# Patient Record
Sex: Female | Born: 1970 | Race: Black or African American | Hispanic: No | Marital: Married | Smoking: Former smoker
Health system: Southern US, Community
[De-identification: ages and names within clinical notes are randomized; demographics above are authoritative.]

## PROBLEM LIST (undated history)

## (undated) DIAGNOSIS — C569 Malignant neoplasm of unspecified ovary: Secondary | ICD-10-CM

## (undated) DIAGNOSIS — G43909 Migraine, unspecified, not intractable, without status migrainosus: Secondary | ICD-10-CM

## (undated) HISTORY — DX: Migraine, unspecified, not intractable, without status migrainosus: G43.909

## (undated) HISTORY — PX: HERNIA REPAIR: SHX51

## (undated) HISTORY — PX: ABDOMINAL HYSTERECTOMY: SHX81

---

## 2003-11-28 ENCOUNTER — Ambulatory Visit: Admission: RE | Admit: 2003-11-28 | Discharge: 2003-11-28 | Payer: Self-pay | Admitting: Gynecology

## 2004-07-08 ENCOUNTER — Ambulatory Visit (HOSPITAL_COMMUNITY): Admission: RE | Admit: 2004-07-08 | Discharge: 2004-07-08 | Payer: Self-pay | Admitting: Obstetrics and Gynecology

## 2004-07-18 ENCOUNTER — Ambulatory Visit: Admission: RE | Admit: 2004-07-18 | Discharge: 2004-07-18 | Payer: Self-pay | Admitting: Gynecology

## 2004-09-02 ENCOUNTER — Inpatient Hospital Stay (HOSPITAL_COMMUNITY): Admission: RE | Admit: 2004-09-02 | Discharge: 2004-09-04 | Payer: Self-pay | Admitting: Obstetrics and Gynecology

## 2006-01-28 ENCOUNTER — Emergency Department (HOSPITAL_COMMUNITY): Admission: EM | Admit: 2006-01-28 | Discharge: 2006-01-29 | Payer: Self-pay | Admitting: Emergency Medicine

## 2006-01-31 ENCOUNTER — Emergency Department (HOSPITAL_COMMUNITY): Admission: EM | Admit: 2006-01-31 | Discharge: 2006-01-31 | Payer: Self-pay | Admitting: Emergency Medicine

## 2010-05-04 ENCOUNTER — Encounter: Payer: Self-pay | Admitting: Obstetrics and Gynecology

## 2011-09-21 ENCOUNTER — Emergency Department (HOSPITAL_COMMUNITY): Payer: No Typology Code available for payment source

## 2011-09-21 ENCOUNTER — Emergency Department (HOSPITAL_COMMUNITY)
Admission: EM | Admit: 2011-09-21 | Discharge: 2011-09-21 | Disposition: A | Discharge status: Home / Self Care | Payer: No Typology Code available for payment source | Attending: Emergency Medicine | Admitting: Emergency Medicine

## 2011-09-21 ENCOUNTER — Encounter (HOSPITAL_COMMUNITY): Payer: Self-pay | Admitting: *Deleted

## 2011-09-21 DIAGNOSIS — R10819 Abdominal tenderness, unspecified site: Secondary | ICD-10-CM | POA: Insufficient documentation

## 2011-09-21 DIAGNOSIS — T148XXA Other injury of unspecified body region, initial encounter: Secondary | ICD-10-CM

## 2011-09-21 DIAGNOSIS — M25559 Pain in unspecified hip: Secondary | ICD-10-CM | POA: Insufficient documentation

## 2011-09-21 DIAGNOSIS — M79609 Pain in unspecified limb: Secondary | ICD-10-CM | POA: Insufficient documentation

## 2011-09-21 DIAGNOSIS — M542 Cervicalgia: Secondary | ICD-10-CM | POA: Insufficient documentation

## 2011-09-21 DIAGNOSIS — R109 Unspecified abdominal pain: Secondary | ICD-10-CM | POA: Insufficient documentation

## 2011-09-21 DIAGNOSIS — M549 Dorsalgia, unspecified: Secondary | ICD-10-CM | POA: Insufficient documentation

## 2011-09-21 DIAGNOSIS — R51 Headache: Secondary | ICD-10-CM | POA: Insufficient documentation

## 2011-09-21 HISTORY — DX: Malignant neoplasm of unspecified ovary: C56.9

## 2011-09-21 LAB — POCT I-STAT, CHEM 8
BUN: 13 mg/dL (ref 6–23)
Calcium, Ion: 1.11 mmol/L — ABNORMAL LOW (ref 1.12–1.32)
Chloride: 112 mEq/L (ref 96–112)
Creatinine, Ser: 0.9 mg/dL (ref 0.50–1.10)
Glucose, Bld: 76 mg/dL (ref 70–99)
HCT: 33 % — ABNORMAL LOW (ref 36.0–46.0)
Hemoglobin: 11.2 g/dL — ABNORMAL LOW (ref 12.0–15.0)
Potassium: 3.6 mEq/L (ref 3.5–5.1)
Sodium: 143 mEq/L (ref 135–145)
TCO2: 19 mmol/L (ref 0–100)

## 2011-09-21 MED ORDER — IOHEXOL 300 MG/ML  SOLN
100.0000 mL | Freq: Once | INTRAMUSCULAR | Status: AC | PRN
  Administered 2011-09-21: 100 mL via INTRAVENOUS

## 2011-09-21 MED ORDER — IBUPROFEN 600 MG PO TABS: 600.0000 mg | ORAL_TABLET | Freq: Four times a day (QID) | ORAL | Status: AC | PRN

## 2011-09-21 MED ORDER — MORPHINE SULFATE 4 MG/ML IJ SOLN
4.0000 mg | Freq: Once | INTRAMUSCULAR | Status: AC
  Administered 2011-09-21: 4 mg via INTRAVENOUS
  Filled 2011-09-21: qty 1

## 2011-09-21 MED ORDER — HYDROCODONE-ACETAMINOPHEN 5-325 MG PO TABS: 2.0000 | ORAL_TABLET | ORAL | Status: AC | PRN
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
## 2011-09-21 NOTE — Discharge Instructions (Signed)
Bone Bruise  A bone bruise is a small hidden fracture of the bone. It typically occurs with bones located close to the surface of the skin.  SYMPTOMS  The pain lasts longer than a normal bruise.   The bruised area is difficult to use.   There may be discoloration or swelling of the bruised area.   When a bone bruise is found with injury to the anterior cruciate ligament (in the knee) there is often an increased:   Amount of fluid in the knee   Time the fluid in the knee lasts.   Number of days until you are walking normally and regaining the motion you had before the injury.   Number of days with pain from the injury.  DIAGNOSIS  It can only be seen on X-rays known as MRIs. This stands for magnetic resonance imaging. A regular X-ray taken of a bone bruise would appear to be normal. A bone bruise is a common injury in the knee and the heel bone (calcaneus). The problems are similar to those produced by stress fractures, which are bone injuries caused by overuse. A bone bruise may also be a sign of other injuries. For example, bone bruises are commonly found where an anterior cruciate ligament (ACL) in the knee has been pulled away from the bone (ruptured). A ligament is a tough fibrous material that connects bones together to make our joints stable. Bruises of the bone last a lot longer than bruises of the muscle or tissues beneath the skin. Bone bruises can last from days to months and are often more severe and painful than other bruises. TREATMENT Because bone bruises are sudden injuries you cannot often prevent them, other than by being extremely careful. Some things you can do to improve the condition are:  Apply ice to the sore area for 15 to 20 minutes, 3 to 4 times per day while awake for the first 2 days. Put the ice in a plastic bag, and place a towel between the bag of ice and your skin.   Keep your bruised area raised (elevated) when possible to lessen swelling.   For activity:     Use crutches when necessary; do not put weight on the injured leg until you are no longer tender.   You may walk on your affected part as the pain allows, or as instructed.   Start weight bearing gradually on the bruised part.   Continue to use crutches or a cane until you can stand without causing pain, or as instructed.   If a plaster splint was applied, wear the splint until you are seen for a follow-up examination. Rest it on nothing harder than a pillow the first 24 hours. Do not put weight on it. Do not get it wet. You may take it off to take a shower or bath.   If an air splint was applied, more air may be blown into or out of the splint as needed for comfort. You may take it off at night and to take a shower or bath.   Wiggle your toes in the splint several times per day if you are able.   You may have been given an elastic bandage to use with the plaster splint or alone. The splint is too tight if you have numbness, tingling or if your foot becomes cold and blue. Adjust the bandage to make it comfortable.   Only take over-the-counter or prescription medicines for pain, discomfort, or fever as directed by   your caregiver.   Follow all instructions for follow up with your caregiver. This includes any orthopedic referrals, physical therapy, and rehabilitation. Any delay in obtaining necessary care could result in a delay or failure of the bones to heal.  SEEK MEDICAL CARE IF:   You have an increase in bruising, swelling, or pain.   You notice coldness of your toes.   You do not get pain relief with medications.  SEEK IMMEDIATE MEDICAL CARE IF:   Your toes are numb or blue.   You have severe pain not controlled with medications.   If any of the problems that caused you to seek care are becoming worse.  Document Released: 06/20/2003 Document Revised: 03/19/2011 Document Reviewed: 11/02/2007 [facility] Patient Information 2012 [place], [place].  RESOURCE GUIDE  Dental  Problems  Patients with Medicaid: [facility] [phone]                                           [phone] [address] Phone:  [phone]                                                   Phone:  [phone]  If unable to pay or uninsured, contact:  [facility] or [facility]. to become qualified for the adult dental clinic.  Chronic Pain Problems Contact [facility]  [phone] Patients need to be referred by their primary care doctor.  Insufficient Money for Medicine Contact [employer]:  call "211" or [facility] [phone].  No Primary Care Doctor Call [facility]  [phone] Other agencies that provide inexpensive medical care    [facility]  [phone]    [facility]  [phone]    [facility]  [phone]    [facility]  [phone]    [facility]  [phone]    [facility] [place] [facility]  [phone]  Psychological Services [facility]  [phone] [facility]  [phone] [facility]   [phone] (emergency services [phone])  Abuse/Neglect [facility] Child Abuse Hotline 778-648-4541 [facility] Child Abuse Hotline 9731016163 (After Hours)  Emergency Shelter [facility] [phone]  M[facility] [place] [facility] [phone] [facility] [phone]  MRSA Hotline #:   220-305-9968    [facility] Resources  [facility]  [employer]                           [facility] [address] [place]                     [address]         [address]  [address]  Cristobal Goldmann Phone:  [phone]                                  Phone:  [phone]                   Phone:  [phone]  [facility]  Health Phone:  [phone]  [facility] Child Abuse Hotline 9176697025 [phone] (After Hours)
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
## 2011-09-21 NOTE — ED Notes (Signed)
Pt c/o lower back, L hand, L neck pain. C/o HA and ringing in ears. Reports earlier en route to hospital, she had a "stabbing" like sensation in RLQ that has since resolved. No seatbelt marks noted.
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
## 2011-09-21 NOTE — ED Notes (Signed)
Patient transported to X-ray 

## 2011-09-27 ENCOUNTER — Encounter (HOSPITAL_COMMUNITY): Payer: Self-pay | Admitting: Emergency Medicine

## 2011-09-27 DIAGNOSIS — S93409A Sprain of unspecified ligament of unspecified ankle, initial encounter: Secondary | ICD-10-CM | POA: Insufficient documentation

## 2011-09-27 DIAGNOSIS — M25579 Pain in unspecified ankle and joints of unspecified foot: Secondary | ICD-10-CM | POA: Insufficient documentation

## 2011-09-27 DIAGNOSIS — S335XXA Sprain of ligaments of lumbar spine, initial encounter: Secondary | ICD-10-CM | POA: Insufficient documentation

## 2011-09-27 DIAGNOSIS — S40019A Contusion of unspecified shoulder, initial encounter: Secondary | ICD-10-CM | POA: Insufficient documentation

## 2011-09-27 DIAGNOSIS — M549 Dorsalgia, unspecified: Secondary | ICD-10-CM | POA: Insufficient documentation
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
## 2011-09-28 ENCOUNTER — Emergency Department (HOSPITAL_COMMUNITY): Payer: No Typology Code available for payment source

## 2011-09-28 ENCOUNTER — Emergency Department (HOSPITAL_COMMUNITY)
Admission: EM | Admit: 2011-09-28 | Discharge: 2011-09-28 | Disposition: A | Discharge status: Home / Self Care | Payer: No Typology Code available for payment source | Attending: Emergency Medicine | Admitting: Emergency Medicine

## 2011-09-28 DIAGNOSIS — M25511 Pain in right shoulder: Secondary | ICD-10-CM

## 2011-09-28 DIAGNOSIS — S93401A Sprain of unspecified ligament of right ankle, initial encounter: Secondary | ICD-10-CM

## 2011-09-28 DIAGNOSIS — S39012A Strain of muscle, fascia and tendon of lower back, initial encounter: Secondary | ICD-10-CM

## 2011-09-28 MED ORDER — CYCLOBENZAPRINE HCL 10 MG PO TABS: 10.0000 mg | ORAL_TABLET | Freq: Two times a day (BID) | ORAL | Status: AC | PRN

## 2011-09-28 MED ORDER — HYDROCODONE-ACETAMINOPHEN 5-325 MG PO TABS: 1.0000 | ORAL_TABLET | ORAL | Status: AC | PRN

## 2011-09-28 MED ORDER — HYDROCODONE-ACETAMINOPHEN 5-325 MG PO TABS
1.0000 | ORAL_TABLET | Freq: Once | ORAL | Status: AC
  Administered 2011-09-28: 1 via ORAL
  Filled 2011-09-28: qty 1

## 2011-09-28 NOTE — Discharge Instructions (Signed)
Ankle Sprain An ankle sprain is an injury to the strong, fibrous tissues (ligaments) that hold the bones of your ankle joint together.  CAUSES Ankle sprain usually is caused by a fall or by twisting your ankle. People who participate in sports are more prone to these types of injuries.  SYMPTOMS  Symptoms of ankle sprain include:  Pain in your ankle. The pain may be present at rest or only when you are trying to stand or walk.   Swelling.   Bruising. Bruising may develop immediately or within 1 to 2 days after your injury.   Difficulty standing or walking.  DIAGNOSIS  Your caregiver will ask you details about your injury and perform a physical exam of your ankle to determine if you have an ankle sprain. During the physical exam, your caregiver will press and squeeze specific areas of your foot and ankle. Your caregiver will try to move your ankle in certain ways. An X-ray exam may be done to be sure a bone was not broken or a ligament did not separate from one of the bones in your ankle (avulsion).  TREATMENT  Certain types of braces can help stabilize your ankle. Your caregiver can make a recommendation for this. Your caregiver may recommend the use of medication for pain. If your sprain is severe, your caregiver may refer you to a surgeon who helps to restore function to parts of your skeletal system (orthopedist) or a physical therapist. HOME CARE INSTRUCTIONS  Apply ice to your injury for 1 to 2 days or as directed by your caregiver. Applying ice helps to reduce inflammation and pain.  Put ice in a plastic bag.   Place a towel between your skin and the bag.   Leave the ice on for 15 to 20 minutes at a time, every 2 hours while you are awake.   Take over-the-counter or prescription medicines for pain, discomfort, or fever only as directed by your caregiver.   Keep your injured leg elevated, when possible, to lessen swelling.   If your caregiver recommends crutches, use them as  instructed. Gradually, put weight on the affected ankle. Continue to use crutches or a cane until you can walk without feeling pain in your ankle.   If you have a plaster splint, wear the splint as directed by your caregiver. Do not rest it on anything harder than a pillow the first 24 hours. Do not put weight on it. Do not get it wet. You may take it off to take a shower or bath.   You may have been given an elastic bandage to wear around your ankle to provide support. If the elastic bandage is too tight (you have numbness or tingling in your foot or your foot becomes cold and blue), adjust the bandage to make it comfortable.   If you have an air splint, you may blow more air into it or let air out to make it more comfortable. You may take your splint off at night and before taking a shower or bath.   Wiggle your toes in the splint several times per day if you are able.  SEEK MEDICAL CARE IF:   You have an increase in bruising, swelling, or pain.   Your toes feel cold.   Pain relief is not achieved with medication.  SEEK IMMEDIATE MEDICAL CARE IF: Your toes are numb or blue or you have severe pain. MAKE SURE YOU:   Understand these instructions.   Will watch your condition.     Will get help right away if you are not doing well or get worse.  Document Released: 03/30/2005 Document Revised: 03/19/2011 Document Reviewed: 11/02/2007 [facility] Patient Information 2012 [place], [place].Arthralgia Your caregiver has diagnosed you as suffering from an arthralgia. Arthralgia means there is pain in a joint. This can come from many reasons including:  Bruising the joint which causes soreness (inflammation) in the joint.   Wear and tear on the joints which occur as we grow older (osteoarthritis).   Overusing the joint.   Various forms of arthritis.   Infections of the joint.  Regardless of the cause of pain in your joint, most of these different pains respond to anti-inflammatory drugs and  rest. The exception to this is when a joint is infected, and these cases are treated with antibiotics, if it is a bacterial infection. HOME CARE INSTRUCTIONS   Rest the injured area for as long as directed by your caregiver. Then slowly start using the joint as directed by your caregiver and as the pain allows. Crutches as directed may be useful if the ankles, knees or hips are involved. If the knee was splinted or casted, continue use and care as directed. If an stretchy or elastic wrapping bandage has been applied today, it should be removed and re-applied every 3 to 4 hours. It should not be applied tightly, but firmly enough to keep swelling down. Watch toes and feet for swelling, bluish discoloration, coldness, numbness or excessive pain. If any of these problems (symptoms) occur, remove the ace bandage and re-apply more loosely. If these symptoms persist, contact your caregiver or return to this location.   For the first 24 hours, keep the injured extremity elevated on pillows while lying down.   Apply ice for 15 to 20 minutes to the sore joint every couple hours while awake for the first half day. Then 3 to 4 times per day for the first 48 hours. Put the ice in a plastic bag and place a towel between the bag of ice and your skin.   Wear any splinting, casting, elastic bandage applications, or slings as instructed.   Only take over-the-counter or prescription medicines for pain, discomfort, or fever as directed by your caregiver. Do not use aspirin immediately after the injury unless instructed by your physician. Aspirin can cause increased bleeding and bruising of the tissues.   If you were given crutches, continue to use them as instructed and do not resume weight bearing on the sore joint until instructed.  Persistent pain and inability to use the sore joint as directed for more than 2 to 3 days are warning signs indicating that you should see a caregiver for a follow-up visit as soon as  possible. Initially, a hairline fracture (break in bone) may not be evident on X-rays. Persistent pain and swelling indicate that further evaluation, non-weight bearing or use of the joint (use of crutches or slings as instructed), or further X-rays are indicated. X-rays may sometimes not show a small fracture until a week or 10 days later. Make a follow-up appointment with your own caregiver or one to whom we have referred you. A radiologist (specialist in reading X-rays) may read your X-rays. Make sure you know how you are to obtain your X-ray results. Do not assume everything is normal if you do not hear from [place]. SEEK MEDICAL CARE IF: Bruising, swelling, or pain increases. SEEK IMMEDIATE MEDICAL CARE IF:   Your fingers or toes are numb or blue.   The pain  is not responding to medications and continues to stay the same or get worse.   The pain in your joint becomes severe.   You develop a fever over 102 F (38.9 C).   It becomes impossible to move or use the joint.  MAKE SURE YOU:   Understand these instructions.   Will watch your condition.   Will get help right away if you are not doing well or get worse.  Document Released: 03/30/2005 Document Revised: 03/19/2011 Document Reviewed: 11/16/2007 [facility] Patient Information 2012 [place], [place].Lumbosacral Strain Lumbosacral strain is one of the most common causes of back pain. There are many causes of back pain. Most are not serious conditions. CAUSES  Your backbone (spinal column) is made up of 24 main vertebral bodies, the sacrum, and the coccyx. These are held together by muscles and tough, fibrous tissue (ligaments). Nerve roots pass through the openings between the vertebrae. A sudden move or injury to the back may cause injury to, or pressure on, these nerves. This may result in localized back pain or pain movement (radiation) into the buttocks, down the leg, and into the foot. Sharp, shooting pain from the buttock down the back of  the leg (sciatica) is frequently associated with a ruptured (herniated) disk. Pain may be caused by muscle spasm alone. Your caregiver can often find the cause of your pain by the details of your symptoms and an exam. In some cases, you may need tests (such as X-rays). Your caregiver will work with you to decide if any tests are needed based on your specific exam. HOME CARE INSTRUCTIONS   Avoid an underactive lifestyle. Active exercise, as directed by your caregiver, is your greatest weapon against back pain.   Avoid hard physical activities (tennis, racquetball, waterskiing) if you are not in proper physical condition for it. This may aggravate or create problems.   If you have a back problem, avoid sports requiring sudden body movements. Swimming and walking are generally safer activities.   Maintain good posture.   Avoid becoming overweight (obese).   Use bed rest for only the most extreme, sudden (acute) episode. Your caregiver will help you determine how much bed rest is necessary.   For acute conditions, you may put ice on the injured area.   Put ice in a plastic bag.   Place a towel between your skin and the bag.   Leave the ice on for 15 to 20 minutes at a time, every 2 hours, or as needed.   After you are improved and more active, it may help to apply heat for 30 minutes before activities.  See your caregiver if you are having pain that lasts longer than expected. Your caregiver can advise appropriate exercises or therapy if needed. With conditioning, most back problems can be avoided. SEEK IMMEDIATE MEDICAL CARE IF:   You have numbness, tingling, weakness, or problems with the use of your arms or legs.   You experience severe back pain not relieved with medicines.   There is a change in bowel or bladder control.   You have increasing pain in any area of the body, including your belly (abdomen).   You notice shortness of breath, dizziness, or feel faint.   You feel sick  to your stomach (nauseous), are throwing up (vomiting), or become sweaty.   You notice discoloration of your toes or legs, or your feet get very cold.   Your back pain is getting worse.   You have a fever.  MAKE  SURE YOU:   Understand these instructions.   Will watch your condition.   Will get help right away if you are not doing well or get worse.  Document Released: 01/07/2005 Document Revised: 03/19/2011 Document Reviewed: 06/29/2008 [facility] Patient Information 2012 [place], [place].
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
## 2011-09-28 NOTE — ED Notes (Signed)
Patient discharge via ambulatory with steady gait. Respirations equal and unlabored. Skin warm and dry. No acute distress noted. 

## 2011-09-29 NOTE — ED Provider Notes (Signed)
Medical screening examination/treatment/procedure(s) were performed by non-physician practitioner and as supervising physician I was immediately available for consultation/collaboration.  Olivia Mackie, MD 09/29/11 1023

## 2011-11-12 ENCOUNTER — Other Ambulatory Visit (HOSPITAL_COMMUNITY): Payer: Self-pay | Admitting: Obstetrics and Gynecology

## 2011-11-12 DIAGNOSIS — Z1231 Encounter for screening mammogram for malignant neoplasm of breast: Secondary | ICD-10-CM

## 2011-12-01 ENCOUNTER — Ambulatory Visit (HOSPITAL_COMMUNITY)
Admission: RE | Admit: 2011-12-01 | Discharge: 2011-12-01 | Disposition: A | Discharge status: Home / Self Care | Payer: Medicaid Other | Source: Ambulatory Visit | Attending: Obstetrics and Gynecology | Admitting: Obstetrics and Gynecology

## 2011-12-01 DIAGNOSIS — Z1231 Encounter for screening mammogram for malignant neoplasm of breast: Secondary | ICD-10-CM | POA: Insufficient documentation

## 2012-09-12 ENCOUNTER — Encounter (HOSPITAL_COMMUNITY): Payer: Self-pay | Admitting: Emergency Medicine

## 2012-09-12 ENCOUNTER — Emergency Department (HOSPITAL_COMMUNITY): Payer: Self-pay

## 2012-09-12 ENCOUNTER — Emergency Department (HOSPITAL_COMMUNITY)
Admission: EM | Admit: 2012-09-12 | Discharge: 2012-09-12 | Disposition: A | Discharge status: Home / Self Care | Payer: Self-pay | Attending: Emergency Medicine | Admitting: Emergency Medicine

## 2012-09-12 DIAGNOSIS — M25572 Pain in left ankle and joints of left foot: Secondary | ICD-10-CM

## 2012-09-12 DIAGNOSIS — Z87891 Personal history of nicotine dependence: Secondary | ICD-10-CM | POA: Insufficient documentation

## 2012-09-12 DIAGNOSIS — Z79899 Other long term (current) drug therapy: Secondary | ICD-10-CM | POA: Insufficient documentation

## 2012-09-12 DIAGNOSIS — M79609 Pain in unspecified limb: Secondary | ICD-10-CM | POA: Insufficient documentation

## 2012-09-12 DIAGNOSIS — M79605 Pain in left leg: Secondary | ICD-10-CM

## 2012-09-12 DIAGNOSIS — Z8543 Personal history of malignant neoplasm of ovary: Secondary | ICD-10-CM | POA: Insufficient documentation

## 2012-09-12 DIAGNOSIS — M25579 Pain in unspecified ankle and joints of unspecified foot: Secondary | ICD-10-CM | POA: Insufficient documentation

## 2012-09-12 MED ORDER — OXYCODONE-ACETAMINOPHEN 5-325 MG PO TABS
1.0000 | ORAL_TABLET | Freq: Once | ORAL | Status: AC
  Administered 2012-09-12: 1 via ORAL
  Filled 2012-09-12: qty 1

## 2012-09-12 MED ORDER — NAPROXEN 375 MG PO TABS: 375.0000 mg | ORAL_TABLET | Freq: Two times a day (BID) | ORAL | Status: DC

## 2012-09-12 NOTE — ED Notes (Signed)
Pt has been jogging for 2 weeks. Started 3 days ago with left knee and ankle pain. No deformity.

## 2012-09-12 NOTE — ED Notes (Signed)
States has been jogging and now her  Left knee hurts all down to foot and her toes are swollen hurts to bear wt she states

## 2012-09-12 NOTE — ED Notes (Signed)
Pt currently in xray

## 2012-09-12 NOTE — ED Provider Notes (Signed)
History    This chart was scribed for Cynthia Mutton, PA working with Devoria Albe, MD by ED Scribe, Burman Nieves. This patient was seen in room TR06C/TR06C and the patient's care was started at 3:08 PM.   CSN: [id number]  Arrival date & time 09/12/12  1351   First MD Initiated Contact with Patient 09/12/12 1508      Chief Complaint  Patient presents with  . Leg Pain    (Consider location/radiation/quality/duration/timing/severity/associated sxs/prior treatment) The history is provided by the patient and a relative. No language interpreter was used.   HPI Comments: Cynthia Wang is a 42 y.o. female who presents to the Emergency Department complaining of moderate constant left leg pain which started three days ago with associated left ankle pain. Pt states that she has been jogging with her daughter constantly for the past week at different elevations. She reports the pain in her left knee is separate than the pain in her left ankle. She states she was exerting herself more than usual  Followed by stepping unevenly on some concrete resulting in hearing a pop noise. She states that it feels like something is loose with a stabbing sensation in her left knee. Pain is exacerbates during any weight bearing activity. She states she can not bend or extend her leg out completely due to pain. Pt reports she put some bio freeze last night on the affected area with no immediate relief. Pt denies ever having any injury to the affected area. Pt denies numbness/tingling in her lower extremities, chest pain, fever, chills, cough, nausea, vomiting, diarrhea, SOB, weakness, and any other associated symptoms. Pt's current PCP is Dr. Shon Baton.    Past Medical History  Diagnosis Date  . Ovarian cancer     Past Surgical History  Procedure Laterality Date  . Abdominal hysterectomy      No family history on file.  History  Substance Use Topics  . Smoking status: Former Smoker    Quit date: 09/27/2010  .  Smokeless tobacco: Never Used  . Alcohol Use: No    OB History   Grav Para Term Preterm Abortions TAB SAB Ect Mult Living                  Review of Systems  Musculoskeletal: Positive for myalgias and arthralgias.  All other systems reviewed and are negative.    Allergies  Neosporin  Home Medications   Current Outpatient Rx  Name  Route  Sig  Dispense  Refill  . ALPRAZolam (XANAX) 1 MG tablet   Oral   Take 1 mg by mouth 3 (three) times daily as needed for sleep or anxiety.         Marland Kitchen HYDROcodone-acetaminophen (NORCO) 10-325 MG per tablet   Oral   Take 1 tablet by mouth every 6 (six) hours as needed for pain.         Marland Kitchen ibuprofen (ADVIL,MOTRIN) 800 MG tablet   Oral   Take 800 mg by mouth every 8 (eight) hours as needed for pain.         . naproxen (NAPROSYN) 375 MG tablet   Oral   Take 1 tablet (375 mg total) by mouth 2 (two) times daily.   20 tablet   0     BP 127/73  Pulse 62  Temp(Src) 97.6 F (36.4 C) (Oral)  Resp 16  SpO2 99%  Physical Exam  Nursing note and vitals reviewed. Constitutional: She is oriented to person, place, and time. She appears  well-developed and well-nourished. No distress.  HENT:  Head: Normocephalic and atraumatic.  Eyes: EOM are normal.  Neck: Normal range of motion. Neck supple. No tracheal deviation present.  Cardiovascular: Normal rate.   Pulses:      Radial pulses are 2+ on the right side, and 2+ on the left side.       Dorsalis pedis pulses are 2+ on the right side, and 2+ on the left side.  Pulmonary/Chest: Effort normal. No respiratory distress.  Musculoskeletal: She exhibits edema and tenderness.  Swelling is evident to the lateral malleolus. Pain upon palpation to the medial aspect of the left ankle to base of big toe on the left side. Pain upon medial aspect of left knee, swelling noted. Negative erythema, effusion, inflammation, and warm to touch. Negative anterior and posterior draw sign. Positive Mcmurray's in  left leg. Positive valgus negative varus tension. No pain upon palpation to patella tendon or patella.    Neurological: She is alert and oriented to person, place, and time.  Sensation in intact with dull and sharp sensations.   Skin: Skin is warm and dry.  Psychiatric: She has a normal mood and affect. Her behavior is normal.    ED Course  Procedures (including critical care time) DIAGNOSTIC STUDIES: Oxygen Saturation is 99% on room air, normal by my interpretation.    COORDINATION OF CARE: 4:04 PM Discussed ED treatment with pt and pt agrees.  6:26 PM Discussed with pt to wear brace when ambulating and ice 3-4 times a day. Prescribed naproxen and tylenol for pt. Follow up with Dr. Caesar Bookman.   Labs Reviewed - No data to display Dg Ankle Complete Left  09/12/2012   *RADIOLOGY REPORT*  Clinical Data: Left ankle pain.  LEFT ANKLE COMPLETE - 3+ VIEW  Comparison: None  Findings: The ankle mortise is maintained.  No acute ankle fracture.  No osteochondral lesion.  The mid and hind foot bony structures are intact.  No definite ankle joint effusion.  IMPRESSION: No acute bony findings.   Original Report Authenticated By: Rudie Meyer, M.D.   Dg Knee Complete 4 Views Left  09/12/2012   *RADIOLOGY REPORT*  Clinical Data: Generalized left knee pain for 3 days.  LEFT KNEE - COMPLETE 4+ VIEW  Comparison: None.  Findings: Anatomic alignment of the left knee.  There is no fracture.  No definite effusion. Joint spaces appear preserved.  IMPRESSION: Negative.   Original Report Authenticated By: Andreas Newport, M.D.     1. Leg pain, left   2. Ankle pain, left       MDM  I personally performed the services described in this documentation, which was scribed in my presence. The recorded information has been reviewed and is accurate.  Patient stable, afebrile. Presenting with left knee and ankle pain after jogging for 2 weeks straight. Decreased ROM to the left knee secondary to pain, valgus tension pain.  Negative posterior-anterior draw sign. Negative warmth to touch, effusion, inflammation - ruling out septic joint. Swelling noted to left knee and left ankle - without ecchymosis. Negative pain to posterior aspect of left knee - negative calf pain - negative pain with passive motion - ruling out baker's cyst. Pain controlled in ED setting. Patient placed in knee immobilizer and ace bandage of left ankle - has crutches - discussed no weight bearing. Negative acute findings - suspicion for knee and ankle sprain secondary to stress. Patient to be discharged. Discussed rest, ice, elevation. Discussed to keep in ace bandage and immobilizer.  Referred to orthopedics for follow-up. Discussed with patient no strenuous activity. Small dose of pain medication given - discussed course and precautions. Discussed with patient to monitor symptoms and if symptoms are to worsen or change to report back to the ED. Patient agreed to plan of care, understood, all questions answered.    Cynthia Mutton, PA-C 09/13/12 7829  Cynthia Mutton, PA-C 09/13/12 5621

## 2012-09-12 NOTE — Progress Notes (Signed)
Orthopedic Tech Progress Note Patient Details:  Cynthia Wang 1970-07-05 [id number]  Ortho Devices Type of Ortho Device: Ace wrap;Knee Immobilizer Ortho Device/Splint Location: left leg/ankle Ortho Device/Splint Interventions: Application   [name] 09/12/2012, 6:27 PM

## 2012-09-13 NOTE — ED Provider Notes (Signed)
Medical screening examination/treatment/procedure(s) were performed by non-physician practitioner and as supervising physician I was immediately available for consultation/collaboration. Shylah Dossantos, MD, FACEP   Sherard Sutch L Keston Seever, MD 09/13/12 1237 

## 2014-05-11 ENCOUNTER — Other Ambulatory Visit (HOSPITAL_COMMUNITY): Payer: Self-pay | Admitting: Obstetrics and Gynecology

## 2014-05-11 DIAGNOSIS — Z1231 Encounter for screening mammogram for malignant neoplasm of breast: Secondary | ICD-10-CM

## 2014-05-18 ENCOUNTER — Ambulatory Visit (HOSPITAL_COMMUNITY)
Admission: RE | Admit: 2014-05-18 | Discharge: 2014-05-18 | Disposition: A | Discharge status: Home / Self Care | Payer: PRIVATE HEALTH INSURANCE | Source: Ambulatory Visit | Attending: Obstetrics and Gynecology | Admitting: Obstetrics and Gynecology

## 2014-05-18 ENCOUNTER — Ambulatory Visit (HOSPITAL_COMMUNITY): Payer: No Typology Code available for payment source

## 2014-05-18 DIAGNOSIS — Z1231 Encounter for screening mammogram for malignant neoplasm of breast: Secondary | ICD-10-CM | POA: Insufficient documentation

## 2014-08-09 ENCOUNTER — Other Ambulatory Visit (HOSPITAL_COMMUNITY): Payer: Self-pay | Admitting: Obstetrics and Gynecology

## 2014-08-09 DIAGNOSIS — R1011 Right upper quadrant pain: Secondary | ICD-10-CM

## 2014-08-09 DIAGNOSIS — Z8543 Personal history of malignant neoplasm of ovary: Secondary | ICD-10-CM

## 2014-08-09 DIAGNOSIS — R1901 Right upper quadrant abdominal swelling, mass and lump: Secondary | ICD-10-CM

## 2014-08-10 ENCOUNTER — Ambulatory Visit (HOSPITAL_COMMUNITY): Payer: PRIVATE HEALTH INSURANCE

## 2014-08-30 ENCOUNTER — Ambulatory Visit (HOSPITAL_COMMUNITY)
Admission: RE | Admit: 2014-08-30 | Discharge: 2014-08-30 | Disposition: A | Discharge status: Home / Self Care | Payer: 59 | Source: Ambulatory Visit | Attending: Obstetrics and Gynecology | Admitting: Obstetrics and Gynecology

## 2014-08-30 DIAGNOSIS — Z8543 Personal history of malignant neoplasm of ovary: Secondary | ICD-10-CM

## 2014-08-30 DIAGNOSIS — Z87891 Personal history of nicotine dependence: Secondary | ICD-10-CM | POA: Diagnosis not present

## 2014-08-30 DIAGNOSIS — R1901 Right upper quadrant abdominal swelling, mass and lump: Secondary | ICD-10-CM | POA: Diagnosis not present

## 2014-08-30 DIAGNOSIS — R1011 Right upper quadrant pain: Secondary | ICD-10-CM | POA: Diagnosis not present

## 2014-08-30 MED ORDER — IOHEXOL 300 MG/ML  SOLN
100.0000 mL | Freq: Once | INTRAMUSCULAR | Status: AC | PRN
  Administered 2014-08-30: 100 mL via INTRAVENOUS

## 2014-09-03 ENCOUNTER — Ambulatory Visit (INDEPENDENT_AMBULATORY_CARE_PROVIDER_SITE_OTHER): Payer: 59 | Admitting: Family Medicine

## 2014-09-03 VITALS — BP 118/72 | HR 66 | Temp 98.0°F | Resp 18 | Ht 66.0 in | Wt 145.0 lb

## 2014-09-03 DIAGNOSIS — J0100 Acute maxillary sinusitis, unspecified: Secondary | ICD-10-CM

## 2014-09-03 MED ORDER — AMOXICILLIN-POT CLAVULANATE 875-125 MG PO TABS: 1.0000 | ORAL_TABLET | Freq: Two times a day (BID) | ORAL | Status: DC

## 2014-09-03 NOTE — Patient Instructions (Signed)
I would recommend you using Afrin nasal spray (oxymetazoline)  twice a day for 2 days and then only at night after that until your symptoms have resolved.    Sinusitis Sinusitis is redness, soreness, and inflammation of the paranasal sinuses. Paranasal sinuses are air pockets within the bones of your face (beneath the eyes, the middle of the forehead, or above the eyes). In healthy paranasal sinuses, mucus is able to drain out, and air is able to circulate through them by way of your nose. However, when your paranasal sinuses are inflamed, mucus and air can become trapped. This can allow bacteria and other germs to grow and cause infection. Sinusitis can develop quickly and last only a short time (acute) or continue over a long period (chronic). Sinusitis that lasts for more than 12 weeks is considered chronic.  CAUSES  Causes of sinusitis include:  Allergies.  Structural abnormalities, such as displacement of the cartilage that separates your nostrils (deviated septum), which can decrease the air flow through your nose and sinuses and affect sinus drainage.  Functional abnormalities, such as when the small hairs (cilia) that line your sinuses and help remove mucus do not work properly or are not present. SIGNS AND SYMPTOMS  Symptoms of acute and chronic sinusitis are the same. The primary symptoms are pain and pressure around the affected sinuses. Other symptoms include:  Upper toothache.  Earache.  Headache.  Bad breath.  Decreased sense of smell and taste.  A cough, which worsens when you are lying flat.  Fatigue.  Fever.  Thick drainage from your nose, which often is green and may contain pus (purulent).  Swelling and warmth over the affected sinuses. DIAGNOSIS  Your health care provider will perform a physical exam. During the exam, your health care provider may:  Look in your nose for signs of abnormal growths in your nostrils (nasal polyps).  Tap over the affected  sinus to check for signs of infection.  View the inside of your sinuses (endoscopy) using an imaging device that has a light attached (endoscope). If your health care provider suspects that you have chronic sinusitis, one or more of the following tests may be recommended:  Allergy tests.  Nasal culture. A sample of mucus is taken from your nose, sent to a lab, and screened for bacteria.  Nasal cytology. A sample of mucus is taken from your nose and examined by your health care provider to determine if your sinusitis is related to an allergy. TREATMENT  Most cases of acute sinusitis are related to a viral infection and will resolve on their own within 10 days. Sometimes medicines are prescribed to help relieve symptoms (pain medicine, decongestants, nasal steroid sprays, or saline sprays).  However, for sinusitis related to a bacterial infection, your health care provider will prescribe antibiotic medicines. These are medicines that will help kill the bacteria causing the infection.  Rarely, sinusitis is caused by a fungal infection. In theses cases, your health care provider will prescribe antifungal medicine. For some cases of chronic sinusitis, surgery is needed. Generally, these are cases in which sinusitis recurs more than 3 times per year, despite other treatments. HOME CARE INSTRUCTIONS   Drink plenty of water. Water helps thin the mucus so your sinuses can drain more easily.  Use a humidifier.  Inhale steam 3 to 4 times a day (for example, sit in the bathroom with the shower running).  Apply a warm, moist washcloth to your face 3 to 4 times a day, or  as directed by your health care provider.  Use saline nasal sprays to help moisten and clean your sinuses.  Take medicines only as directed by your health care provider.  If you were prescribed either an antibiotic or antifungal medicine, finish it all even if you start to feel better. SEEK IMMEDIATE MEDICAL CARE IF:  You have  increasing pain or severe headaches.  You have nausea, vomiting, or drowsiness.  You have swelling around your face.  You have vision problems.  You have a stiff neck.  You have difficulty breathing. MAKE SURE YOU:   Understand these instructions.  Will watch your condition.  Will get help right away if you are not doing well or get worse. Document Released: 03/30/2005 Document Revised: 08/14/2013 Document Reviewed: 04/14/2011 [facility] Patient Information 2015 [place], [place]. This information is not intended to replace advice given to you by your health care provider. Make sure you discuss any questions you have with your health care provider.

## 2014-09-03 NOTE — Progress Notes (Signed)
° °  Subjective:  This chart was scribed for Robyn Haber, MD by Moises Blood, Medical Scribe. This patient was seen in room 11 and the patient's care was started 12:54 PM.    Patient ID: Cynthia Wang, female    DOB: Jan 24, 1971, 43 y.o.   MRN: [id number]  HPI Cynthia Wang is a 44 y.o. female who presents to [facility] complaining of gradual onset nasal congestion starting last week. She has associated symptoms of rhinorrhea and productive green, "stinky" phlegm. She hasn't taken any medication to relieve it. She denies cough.   She works at [employer]. 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     Review of Systems  HENT: Positive for congestion, rhinorrhea and sinus pressure.   Respiratory: Negative for cough.        Objective:   Physical Exam  Nursing note and vitals reviewed. BP 118/72 mmHg   Pulse 66   Temp(Src) 98 F (36.7 C) (Oral)   Resp 18   Ht 5\' 6"  (1.676 m)   Wt 145 lb (65.772 kg)   BMI 23.41 kg/m2 HEENT: Remarkable only for narrowed sinus passages, ears and oropharynx are clear Neck: Supple no adenopathy Chest: Clear Heart: Regular no murmur Patient has a nasal voice There is no rashes Patient has tenderness over her left maxilla        Assessment & Plan:   This chart was scribed in my presence and reviewed by me personally.    ICD-9-CM ICD-10-CM   1. Acute maxillary sinusitis, recurrence not specified 461.0 J01.00 amoxicillin-clavulanate (AUGMENTIN) 875-125 MG per tablet   Patient instructed to use Afrin on a temporary basis to get some rest.  Signed, Robyn Haber, MD

## 2014-09-25 ENCOUNTER — Telehealth: Payer: Self-pay

## 2014-09-25 NOTE — Telephone Encounter (Signed)
[facility] [address]   Requesting more antibiotics - still is no better.  [phone]

## 2014-09-25 NOTE — Telephone Encounter (Signed)
Spoke with pt, advised to RTC. Pt will come in tomorrow.

## 2014-09-28 ENCOUNTER — Ambulatory Visit (INDEPENDENT_AMBULATORY_CARE_PROVIDER_SITE_OTHER): Payer: 59 | Admitting: Family Medicine

## 2014-09-28 ENCOUNTER — Ambulatory Visit (INDEPENDENT_AMBULATORY_CARE_PROVIDER_SITE_OTHER): Payer: 59

## 2014-09-28 VITALS — BP 110/70 | HR 72 | Temp 97.6°F | Resp 16 | Ht 66.0 in | Wt 140.6 lb

## 2014-09-28 DIAGNOSIS — R938 Abnormal findings on diagnostic imaging of other specified body structures: Secondary | ICD-10-CM

## 2014-09-28 DIAGNOSIS — R930 Abnormal findings on diagnostic imaging of skull and head, not elsewhere classified: Secondary | ICD-10-CM

## 2014-09-28 DIAGNOSIS — J0101 Acute recurrent maxillary sinusitis: Secondary | ICD-10-CM | POA: Diagnosis not present

## 2014-09-28 LAB — POCT CBC
Granulocyte percent: 61.9 %G (ref 37–80)
HCT, POC: 40.8 % (ref 37.7–47.9)
Hemoglobin: 13.1 g/dL (ref 12.2–16.2)
Lymph, poc: 2 (ref 0.6–3.4)
MCH, POC: 27 pg (ref 27–31.2)
MCHC: 32.2 g/dL (ref 31.8–35.4)
MCV: 83.8 fL (ref 80–97)
MID (cbc): 0.4 (ref 0–0.9)
MPV: 8.6 fL (ref 0–99.8)
POC Granulocyte: 4 (ref 2–6.9)
POC LYMPH PERCENT: 31.4 %L (ref 10–50)
POC MID %: 6.7 %M (ref 0–12)
Platelet Count, POC: 239 10*3/uL (ref 142–424)
RBC: 4.87 M/uL (ref 4.04–5.48)
RDW, POC: 15.8 %
WBC: 6.5 10*3/uL (ref 4.6–10.2)

## 2014-09-28 LAB — POCT SEDIMENTATION RATE: POCT SED RATE: 32 mm/hr — AB (ref 0–22)

## 2014-09-28 MED ORDER — LEVOFLOXACIN 500 MG PO TABS: 500.0000 mg | ORAL_TABLET | Freq: Every day | ORAL | Status: DC

## 2014-09-28 NOTE — Progress Notes (Signed)
09/28/2014 at 12:46 PM  Cynthia Wang / DOB: 05-31-1970 / MRN: [id number]  The patient  does not have a problem list on file.  SUBJECTIVE  Chief complaint: pain left side of face and nasla congestion   Cynthia Wang is a 44 y.o. female complaining of nasal blockage and sinus and nasal congestion that started greater than 30 days ago.  Associated symptoms include headache and teeth pain today, and she denies fever, cough and sore throat.The patient symptoms show no change. Treatments tried thus far include Amox-Clav with good  relief initially, however after day 7 of treatment her symtpoms began to come back and she has been suffering since. She completed 10 days of therapy and did not miss doses.  She denies sick contacts.   She  has a past medical history of Ovarian cancer.    Medications reviewed and updated by myself where necessary, and exist elsewhere in the encounter.   Ms. Nolon Bussing is allergic to neosporin. She  reports that she quit smoking about 4 years ago. She has never used smokeless tobacco. She reports that she does not drink alcohol or use illicit drugs. She  has no sexual activity history on file. The patient  has past surgical history that includes Abdominal hysterectomy.  Her family history is not on file.  Review of Systems  Constitutional: Negative for fever and chills.  Respiratory: Negative for shortness of breath.   Cardiovascular: Negative for chest pain.  Gastrointestinal: Negative for nausea and abdominal pain.  Genitourinary: Negative.   Skin: Negative for rash.  Neurological: Negative for dizziness and headaches.    OBJECTIVE  Her  height is 5\' 6"  (1.676 m) and weight is 140 lb 9.6 oz (63.776 kg). Her oral temperature is 97.6 F (36.4 C). Her blood pressure is 110/70 and her pulse is 72. Her respiration is 16.  The patient's body mass index is 22.7 kg/(m^2).  Physical Exam  Constitutional: She is oriented to person, place, and time. She appears  well-developed and well-nourished. No distress.  HENT:  Right Ear: Hearing, tympanic membrane, external ear and ear canal normal.  Left Ear: Hearing, tympanic membrane, external ear and ear canal normal.  Nose: Mucosal edema present. Right sinus exhibits no maxillary sinus tenderness and no frontal sinus tenderness. Left sinus exhibits maxillary sinus tenderness. Left sinus exhibits no frontal sinus tenderness.    Mouth/Throat: Uvula is midline, oropharynx is clear and moist and mucous membranes are normal.  Cardiovascular: Normal rate, regular rhythm and normal heart sounds.   Respiratory: Effort normal and breath sounds normal. She has no wheezes. She has no rales.  Neurological: She is alert and oriented to person, place, and time.  Skin: Skin is warm and dry. She is not diaphoretic.  Psychiatric: She has a normal mood and affect.    Results for orders placed or performed in visit on 09/28/14 (from the past 24 hour(s))  POCT CBC     Status: None   Collection Time: 09/28/14 12:23 PM  Result Value Ref Range   WBC 6.5 4.6 - 10.2 K/uL   Lymph, poc 2.0 0.6 - 3.4   POC LYMPH PERCENT 31.4 10 - 50 %L   MID (cbc) 0.4 0 - 0.9   POC MID % 6.7 0 - 12 %M   POC Granulocyte 4.0 2 - 6.9   Granulocyte percent 61.9 37 - 80 %G   RBC 4.87 4.04 - 5.48 M/uL   Hemoglobin 13.1 12.2 - 16.2 g/dL   HCT, POC  40.8 37.7 - 47.9 %   MCV 83.8 80 - 97 fL   MCH, POC 27.0 27 - 31.2 pg   MCHC 32.2 31.8 - 35.4 g/dL   RDW, POC 15.8 %   Platelet Count, POC 239 142 - 424 K/uL   MPV 8.6 0 - 99.8 fL   [facility] reading (PRIMARY) by  Dr. Joseph Art: Air fluid levels noted in left maxillary sinus.    ASSESSMENT & PLAN  [name] was seen today for pain left side of face and nasla congestion.  Diagnoses and all orders for this visit:  Acute recurrent maxillary sinusitis: Patient with air fluid level on radiograph.  She has failed an excellent eradication therapy.  Will step up abx therapy to Levo.  Should she fail  treatment will refer to ENT for further work up.  Orders: -     DG Sinuses Complete; Future -     POCT SEDIMENTATION RATE -     POCT CBC -     levofloxacin (LEVAQUIN) 500 MG tablet; Take 1 tablet (500 mg total) by mouth daily.  Abnormal x-ray of paranasal sinus with air-fluid level on the left.    The patient was advised to call or come back to clinic if she does not see an improvement in symptoms, or worsens with the above plan.   Philis Fendt, MHS, PA-C [facility] 09/28/2014 12:46 PM  History, physical, and x-rays reviewed with Mr. Carlis Abbott and I agree with this assessment and plan.  Signed, Carola Frost.D.

## 2014-12-07 ENCOUNTER — Encounter (INDEPENDENT_AMBULATORY_CARE_PROVIDER_SITE_OTHER): Payer: Self-pay

## 2014-12-07 ENCOUNTER — Ambulatory Visit (INDEPENDENT_AMBULATORY_CARE_PROVIDER_SITE_OTHER): Payer: 59 | Admitting: Family

## 2014-12-07 ENCOUNTER — Encounter: Payer: Self-pay | Admitting: Family

## 2014-12-07 VITALS — BP 138/90 | HR 66 | Temp 97.7°F | Resp 18 | Ht 66.0 in | Wt 140.0 lb

## 2014-12-07 DIAGNOSIS — J019 Acute sinusitis, unspecified: Secondary | ICD-10-CM

## 2014-12-07 DIAGNOSIS — J0191 Acute recurrent sinusitis, unspecified: Secondary | ICD-10-CM

## 2014-12-07 HISTORY — DX: Acute sinusitis, unspecified: J01.90

## 2014-12-07 MED ORDER — PREDNISONE 10 MG PO TABS: ORAL_TABLET | ORAL | Status: DC

## 2014-12-07 MED ORDER — AMOXICILLIN-POT CLAVULANATE ER 1000-62.5 MG PO TB12: 2.0000 | ORAL_TABLET | Freq: Two times a day (BID) | ORAL | Status: DC

## 2014-12-07 MED ORDER — FLUTICASONE PROPIONATE 50 MCG/ACT NA SUSP: 2.0000 | Freq: Every day | NASAL | Status: DC

## 2014-12-07 NOTE — Progress Notes (Signed)
Pre visit review using our clinic review tool, if applicable. No additional management support is needed unless otherwise documented below in the visit note. 

## 2014-12-07 NOTE — Assessment & Plan Note (Signed)
Symptoms and exam consistent with recurrent bacterial sinusitis. Start high-dose Augmentin. Start prednisone taper. Continue over-the-counter medications as needed for symptoms relief and supportive care. Refer to ear nose and throat if symptoms do not improve.

## 2014-12-07 NOTE — Patient Instructions (Signed)
Thank you for choosing [facility].  Summary/Instructions:  Your prescription(s) have been submitted to your pharmacy or been printed and provided for you. Please take as directed and contact our office if you believe you are having problem(s) with the medication(s) or have any questions.  If your symptoms worsen or fail to improve, please contact our office for further instruction, or in case of emergency go directly to the emergency room at the closest medical facility.   6 Day Prednisone Taper Instructions:   Day 1: Two tablets before breakfast, one after lunch, one after dinner, and two at bedtime.  Day 2: One tablet before breakfast, one after lunch, one after dinner, and two at bedtime Day 3: One tablet before breakfast, one after lunch, one after dinner, and one at bedtime Day 4: One tablet before breakfast, one after lunch, and one at bedtime Day 5: One tablet before breakfast and one at bedtime Day 6: One tablet before breakfast    Sinusitis Sinusitis is redness, soreness, and inflammation of the paranasal sinuses. Paranasal sinuses are air pockets within the bones of your face (beneath the eyes, the middle of the forehead, or above the eyes). In healthy paranasal sinuses, mucus is able to drain out, and air is able to circulate through them by way of your nose. However, when your paranasal sinuses are inflamed, mucus and air can become trapped. This can allow bacteria and other germs to grow and cause infection. Sinusitis can develop quickly and last only a short time (acute) or continue over a long period (chronic). Sinusitis that lasts for more than 12 weeks is considered chronic.  CAUSES  Causes of sinusitis include:  Allergies.  Structural abnormalities, such as displacement of the cartilage that separates your nostrils (deviated septum), which can decrease the air flow through your nose and sinuses and affect sinus drainage.  Functional abnormalities, such as when  the small hairs (cilia) that line your sinuses and help remove mucus do not work properly or are not present. SIGNS AND SYMPTOMS  Symptoms of acute and chronic sinusitis are the same. The primary symptoms are pain and pressure around the affected sinuses. Other symptoms include:  Upper toothache.  Earache.  Headache.  Bad breath.  Decreased sense of smell and taste.  A cough, which worsens when you are lying flat.  Fatigue.  Fever.  Thick drainage from your nose, which often is green and may contain pus (purulent).  Swelling and warmth over the affected sinuses. DIAGNOSIS  Your health care provider will perform a physical exam. During the exam, your health care provider may:  Look in your nose for signs of abnormal growths in your nostrils (nasal polyps).  Tap over the affected sinus to check for signs of infection.  View the inside of your sinuses (endoscopy) using an imaging device that has a light attached (endoscope). If your health care provider suspects that you have chronic sinusitis, one or more of the following tests may be recommended:  Allergy tests.  Nasal culture. A sample of mucus is taken from your nose, sent to a lab, and screened for bacteria.  Nasal cytology. A sample of mucus is taken from your nose and examined by your health care provider to determine if your sinusitis is related to an allergy. TREATMENT  Most cases of acute sinusitis are related to a viral infection and will resolve on their own within 10 days. Sometimes medicines are prescribed to help relieve symptoms (pain medicine, decongestants, nasal steroid sprays, or  saline sprays).  However, for sinusitis related to a bacterial infection, your health care provider will prescribe antibiotic medicines. These are medicines that will help kill the bacteria causing the infection.  Rarely, sinusitis is caused by a fungal infection. In theses cases, your health care provider will prescribe antifungal  medicine. For some cases of chronic sinusitis, surgery is needed. Generally, these are cases in which sinusitis recurs more than 3 times per year, despite other treatments. HOME CARE INSTRUCTIONS   Drink plenty of water. Water helps thin the mucus so your sinuses can drain more easily.  Use a humidifier.  Inhale steam 3 to 4 times a day (for example, sit in the bathroom with the shower running).  Apply a warm, moist washcloth to your face 3 to 4 times a day, or as directed by your health care provider.  Use saline nasal sprays to help moisten and clean your sinuses.  Take medicines only as directed by your health care provider.  If you were prescribed either an antibiotic or antifungal medicine, finish it all even if you start to feel better. SEEK IMMEDIATE MEDICAL CARE IF:  You have increasing pain or severe headaches.  You have nausea, vomiting, or drowsiness.  You have swelling around your face.  You have vision problems.  You have a stiff neck.  You have difficulty breathing. MAKE SURE YOU:   Understand these instructions.  Will watch your condition.  Will get help right away if you are not doing well or get worse. Document Released: 03/30/2005 Document Revised: 08/14/2013 Document Reviewed: 04/14/2011 [facility] Patient Information 2015 [place], [place]. This information is not intended to replace advice given to you by your health care provider. Make sure you discuss any questions you have with your health care provider.

## 2014-12-07 NOTE — Progress Notes (Signed)
Subjective:    Patient ID: Cuca Benassi, female    DOB: [birth year], 44 y.o.   MRN: [id number]  Chief Complaint  Patient presents with  . Establish Care    has been having issues with sinuses for over a month, had a CT scan and was told she had pockets of fluid in cheek, has had stuff come out of her face, major congestion, states that she "smells poop" when bending over and coming back up, possible bacterial infection    HPI:  Donnamaria Shands is a 44 y.o. female with a PMH of migraines and ovarian cancer who presents today for an office visit to establish care.   1.) Sinuses - Associated symptom of facial and sinus pressure has been going on for a couple of months. Primarily located on the left side only. Describes a purulent nasal discharge. Modifying factors include courses of Augmentin and Levaquin which did not help very much. X-ray of her sinuses revealed prominent air-fluid level in the left maxillary sinus consistent with acute sinusitis. Other modifying factors include sudafed which helps with her symptoms for about 4 hours at a time. Also notes that when she bends over she "smells poop."  Allergies  Allergen Reactions  . Neosporin [Neomycin-Bacitracin Zn-Polymyx]      Outpatient Prescriptions Prior to Visit  Medication Sig Dispense Refill  . ESTRADIOL PO Take by mouth.    Marland Kitchen ibuprofen (ADVIL,MOTRIN) 800 MG tablet Take 800 mg by mouth every 8 (eight) hours as needed for pain.    Marland Kitchen levofloxacin (LEVAQUIN) 500 MG tablet Take 1 tablet (500 mg total) by mouth daily. 10 tablet 0   No facility-administered medications prior to visit.     Past Medical History  Diagnosis Date  . Ovarian cancer   . Migraine      Past Surgical History  Procedure Laterality Date  . Abdominal hysterectomy    . Hernia repair       Family History  Problem Relation Age of Onset  . Arthritis Mother   . Depression Mother   . Healthy Father   . Arthritis Maternal Grandmother   .  Hypertension Maternal Grandmother      Social History   Social History  . Marital Status: Single    Spouse Name: N/A  . Number of Children: 2  . Years of Education: 16   Occupational History  . [place] History Main Topics  . Smoking status: Former Smoker    Quit date: 09/27/2006  . Smokeless tobacco: Never Used  . Alcohol Use: No  . Drug Use: No  . Sexual Activity: Yes    Birth Control/ Protection: Surgical   Other Topics Concern  . Not on file   Social History Narrative   Fun: Watching softball game   Denies religious beliefs effecting health care.   Feels safe at home and denies abuse.     Review of Systems  Constitutional: Negative for fever and chills.  HENT: Positive for congestion and sinus pressure. Negative for sore throat.   Respiratory: Negative for chest tightness and shortness of breath.   Neurological: Positive for headaches.      Objective:    BP 138/90 mmHg  Pulse 66  Temp(Src) 97.7 F (36.5 C) (Oral)  Resp 18  Ht 5\' 6"  (1.676 m)  Wt 140 lb (63.504 kg)  BMI 22.61 kg/m2  SpO2 99% Nursing note and vital signs reviewed.  Physical Exam  Constitutional: She is oriented to person, place,  and time. She appears well-developed and well-nourished. No distress.  HENT:  Right Ear: Hearing, tympanic membrane, external ear and ear canal normal.  Left Ear: Hearing, tympanic membrane, external ear and ear canal normal.  Nose: Right sinus exhibits no maxillary sinus tenderness and no frontal sinus tenderness. Left sinus exhibits maxillary sinus tenderness and frontal sinus tenderness.  Mouth/Throat: Uvula is midline, oropharynx is clear and moist and mucous membranes are normal.  Cardiovascular: Normal rate, regular rhythm, normal heart sounds and intact distal pulses.   Pulmonary/Chest: Effort normal and breath sounds normal.  Neurological: She is alert and oriented to person, place, and time.  Skin: Skin is warm and dry.    Psychiatric: She has a normal mood and affect. Her behavior is normal. Judgment and thought content normal.       Assessment & Plan:   Problem List Items Addressed This Visit      Respiratory   Sinusitis, acute - Primary    Symptoms and exam consistent with recurrent bacterial sinusitis. Start high-dose Augmentin. Start prednisone taper. Continue over-the-counter medications as needed for symptoms relief and supportive care. Refer to ear nose and throat if symptoms do not improve.       Relevant Medications   amoxicillin-clavulanate (AUGMENTIN XR) 1000-62.5 MG per tablet   predniSONE (DELTASONE) 10 MG tablet   fluticasone (FLONASE) 50 MCG/ACT nasal spray   Other Relevant Orders   Ambulatory referral to ENT

## 2014-12-13 ENCOUNTER — Telehealth: Payer: Self-pay | Admitting: Family

## 2014-12-13 NOTE — Telephone Encounter (Signed)
Received records from [facility] forwarded 7 pages  to Dr. Mauricio Po 12/13/14 fbg.

## 2015-01-16 ENCOUNTER — Telehealth: Payer: Self-pay | Admitting: Family

## 2015-01-16 MED ORDER — IBUPROFEN 800 MG PO TABS: 800.0000 mg | ORAL_TABLET | Freq: Three times a day (TID) | ORAL | Status: DC | PRN

## 2015-01-16 NOTE — Telephone Encounter (Signed)
Verified [facility] [address]. Patient is requesting ibuprofen (ADVIL,MOTRIN) 800 MG tablet [[id number]] . She states that there has been a delay in ENT referral. She would like something in the meantime.

## 2015-01-16 NOTE — Telephone Encounter (Signed)
Please advise 

## 2015-01-16 NOTE — Telephone Encounter (Signed)
Medication filled.  

## 2015-01-31 ENCOUNTER — Telehealth: Payer: Self-pay | Admitting: *Deleted

## 2015-01-31 MED ORDER — IBUPROFEN 800 MG PO TABS: 800.0000 mg | ORAL_TABLET | Freq: Three times a day (TID) | ORAL | Status: DC | PRN

## 2015-01-31 NOTE — Telephone Encounter (Signed)
Medications refilled

## 2015-01-31 NOTE — Telephone Encounter (Signed)
Left msg on triage stating wanting to get a refill on the ibuprofen 800 mg for ongoing headaches...Cynthia Wang

## 2015-02-01 NOTE — Telephone Encounter (Signed)
Notified pt Cynthia Wang sent medication to pharmacy...[name]

## 2015-02-23 ENCOUNTER — Other Ambulatory Visit: Payer: Self-pay | Admitting: Family

## 2015-03-16 ENCOUNTER — Other Ambulatory Visit: Payer: Self-pay | Admitting: Family

## 2015-04-04 ENCOUNTER — Other Ambulatory Visit: Payer: Self-pay | Admitting: Family

## 2015-05-03 ENCOUNTER — Other Ambulatory Visit: Payer: Self-pay | Admitting: Family

## 2015-05-03 NOTE — Telephone Encounter (Signed)
Please advise in greg calone's absence, thanks 

## 2015-06-05 ENCOUNTER — Other Ambulatory Visit: Payer: Self-pay | Admitting: Internal Medicine

## 2015-06-30 ENCOUNTER — Other Ambulatory Visit: Payer: Self-pay | Admitting: Internal Medicine

## 2015-07-27 ENCOUNTER — Other Ambulatory Visit: Payer: Self-pay | Admitting: Family

## 2015-08-19 ENCOUNTER — Other Ambulatory Visit: Payer: Self-pay | Admitting: Family

## 2015-09-11 ENCOUNTER — Other Ambulatory Visit: Payer: Self-pay | Admitting: Family

## 2015-10-25 ENCOUNTER — Other Ambulatory Visit: Payer: Self-pay

## 2015-10-25 MED ORDER — IBUPROFEN 800 MG PO TABS: 800.0000 mg | ORAL_TABLET | Freq: Three times a day (TID) | ORAL | Status: DC | PRN

## 2015-12-08 ENCOUNTER — Other Ambulatory Visit: Payer: Self-pay | Admitting: Family

## 2016-01-10 ENCOUNTER — Other Ambulatory Visit: Payer: Self-pay | Admitting: Family

## 2016-02-03 ENCOUNTER — Other Ambulatory Visit: Payer: Self-pay | Admitting: Family

## 2016-09-12 ENCOUNTER — Emergency Department (HOSPITAL_COMMUNITY)
Admission: EM | Admit: 2016-09-12 | Discharge: 2016-09-12 | Disposition: A | Discharge status: Home / Self Care | Payer: Worker's Compensation | Attending: Emergency Medicine | Admitting: Emergency Medicine

## 2016-09-12 ENCOUNTER — Encounter (HOSPITAL_COMMUNITY): Payer: Self-pay | Admitting: *Deleted

## 2016-09-12 ENCOUNTER — Emergency Department (HOSPITAL_COMMUNITY): Payer: Worker's Compensation

## 2016-09-12 DIAGNOSIS — Z87891 Personal history of nicotine dependence: Secondary | ICD-10-CM | POA: Diagnosis not present

## 2016-09-12 DIAGNOSIS — Y999 Unspecified external cause status: Secondary | ICD-10-CM | POA: Insufficient documentation

## 2016-09-12 DIAGNOSIS — M545 Low back pain: Secondary | ICD-10-CM | POA: Diagnosis not present

## 2016-09-12 DIAGNOSIS — Y939 Activity, unspecified: Secondary | ICD-10-CM | POA: Diagnosis not present

## 2016-09-12 DIAGNOSIS — S9031XA Contusion of right foot, initial encounter: Secondary | ICD-10-CM | POA: Diagnosis not present

## 2016-09-12 DIAGNOSIS — Y92410 Unspecified street and highway as the place of occurrence of the external cause: Secondary | ICD-10-CM | POA: Insufficient documentation

## 2016-09-12 DIAGNOSIS — Z8543 Personal history of malignant neoplasm of ovary: Secondary | ICD-10-CM | POA: Insufficient documentation

## 2016-09-12 DIAGNOSIS — S299XXA Unspecified injury of thorax, initial encounter: Secondary | ICD-10-CM | POA: Diagnosis present

## 2016-09-12 DIAGNOSIS — S20212A Contusion of left front wall of thorax, initial encounter: Secondary | ICD-10-CM | POA: Insufficient documentation

## 2016-09-12 DIAGNOSIS — Z79899 Other long term (current) drug therapy: Secondary | ICD-10-CM | POA: Insufficient documentation

## 2016-09-12 MED ORDER — IBUPROFEN 400 MG PO TABS
400.0000 mg | ORAL_TABLET | Freq: Once | ORAL | Status: AC
  Administered 2016-09-12: 400 mg via ORAL
  Filled 2016-09-12: qty 1

## 2016-09-12 MED ORDER — ACETAMINOPHEN 500 MG PO TABS
1000.0000 mg | ORAL_TABLET | Freq: Once | ORAL | Status: AC
  Administered 2016-09-12: 1000 mg via ORAL
  Filled 2016-09-12: qty 2

## 2016-09-12 NOTE — Discharge Instructions (Signed)
It was our pleasure to provide your ER care today - we hope that you feel better.  Take ibuprofen and/or acetaminophen as need for pain.   Follow up with primary care doctor in 1 week symptoms fail to improve/resolve.   Return to ER if worse, new symptoms, new or severe pain, other concern.
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
## 2018-06-15 ENCOUNTER — Emergency Department (HOSPITAL_COMMUNITY)
Admission: EM | Admit: 2018-06-15 | Discharge: 2018-06-16 | Disposition: A | Discharge status: Home / Self Care | Payer: PRIVATE HEALTH INSURANCE | Attending: Emergency Medicine | Admitting: Emergency Medicine

## 2018-06-15 ENCOUNTER — Other Ambulatory Visit: Payer: Self-pay

## 2018-06-15 ENCOUNTER — Encounter (HOSPITAL_COMMUNITY): Payer: Self-pay | Admitting: Emergency Medicine

## 2018-06-15 ENCOUNTER — Emergency Department (HOSPITAL_COMMUNITY): Payer: PRIVATE HEALTH INSURANCE

## 2018-06-15 DIAGNOSIS — Z87891 Personal history of nicotine dependence: Secondary | ICD-10-CM | POA: Insufficient documentation

## 2018-06-15 DIAGNOSIS — R197 Diarrhea, unspecified: Secondary | ICD-10-CM

## 2018-06-15 DIAGNOSIS — R05 Cough: Secondary | ICD-10-CM

## 2018-06-15 DIAGNOSIS — I499 Cardiac arrhythmia, unspecified: Secondary | ICD-10-CM

## 2018-06-15 DIAGNOSIS — R109 Unspecified abdominal pain: Secondary | ICD-10-CM | POA: Diagnosis present

## 2018-06-15 DIAGNOSIS — Z79899 Other long term (current) drug therapy: Secondary | ICD-10-CM | POA: Diagnosis not present

## 2018-06-15 DIAGNOSIS — B349 Viral infection, unspecified: Secondary | ICD-10-CM | POA: Diagnosis not present

## 2018-06-15 DIAGNOSIS — R059 Cough, unspecified: Secondary | ICD-10-CM

## 2018-06-15 DIAGNOSIS — I498 Other specified cardiac arrhythmias: Secondary | ICD-10-CM

## 2018-06-15 DIAGNOSIS — R008 Other abnormalities of heart beat: Secondary | ICD-10-CM | POA: Insufficient documentation

## 2018-06-15 LAB — COMPREHENSIVE METABOLIC PANEL
ALT: 13 U/L (ref 0–44)
AST: 18 U/L (ref 15–41)
Albumin: 3.8 g/dL (ref 3.5–5.0)
Alkaline Phosphatase: 32 U/L — ABNORMAL LOW (ref 38–126)
Anion gap: 7 (ref 5–15)
BUN: 12 mg/dL (ref 6–20)
CO2: 26 mmol/L (ref 22–32)
Calcium: 9.3 mg/dL (ref 8.9–10.3)
Chloride: 108 mmol/L (ref 98–111)
Creatinine, Ser: 0.78 mg/dL (ref 0.44–1.00)
GFR calc Af Amer: >60 mL/min (ref >60)
GFR calc non Af Amer: >60 mL/min (ref >60)
Glucose, Bld: 89 mg/dL (ref 70–99)
Potassium: 3.8 mmol/L (ref 3.5–5.1)
Sodium: 141 mmol/L (ref 135–145)
Total Bilirubin: 0.8 mg/dL (ref 0.3–1.2)
Total Protein: 6.4 g/dL — ABNORMAL LOW (ref 6.5–8.1)

## 2018-06-15 LAB — LIPASE, BLOOD: Lipase: 26 U/L (ref 11–51)

## 2018-06-15 LAB — CBC
HCT: 39.5 % (ref 36.0–46.0)
Hemoglobin: 13.2 g/dL (ref 12.0–15.0)
MCH: 30.4 pg (ref 26.0–34.0)
MCHC: 33.4 g/dL (ref 30.0–36.0)
MCV: 91 fL (ref 80.0–100.0)
Platelets: 251 10*3/uL (ref 150–400)
RBC: 4.34 MIL/uL (ref 3.87–5.11)
RDW: 12.3 % (ref 11.5–15.5)
WBC: 5.4 10*3/uL (ref 4.0–10.5)
nRBC: 0 % (ref 0.0–0.2)

## 2018-06-15 MED ORDER — IOHEXOL 300 MG/ML  SOLN
100.0000 mL | Freq: Once | INTRAMUSCULAR | Status: AC | PRN
  Administered 2018-06-15: 100 mL via INTRAVENOUS

## 2018-06-15 MED ORDER — SODIUM CHLORIDE 0.9% FLUSH
3.0000 mL | Freq: Once | INTRAVENOUS | Status: AC
  Administered 2018-06-15: 3 mL via INTRAVENOUS

## 2018-06-15 MED ORDER — SODIUM CHLORIDE 0.9 % IV BOLUS
1000.0000 mL | Freq: Once | INTRAVENOUS | Status: AC
  Administered 2018-06-15: 1000 mL via INTRAVENOUS
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
## 2018-06-15 NOTE — ED Triage Notes (Signed)
Pt states she was sent by her PCP for continuous RUQ pain, nausea, vomiting, and diarrhea. States that she was diagnoses with gastroenteritis by her PCP.

## 2018-06-15 NOTE — ED Notes (Signed)
Pt refusing flu swab collection, [facility] made aware

## 2018-06-15 NOTE — ED Provider Notes (Signed)
[facility] EMERGENCY DEPARTMENT Provider Note   CSN: [id number] Arrival date & time: 06/15/18  1621    History   Chief Complaint Chief Complaint  Patient presents with  . Abdominal Pain    HPI Cynthia Wang is a 48 y.o. female.     HPI  48 year old female history of ovarian cancer 16 years ago presents today complaining of diffuse abdominal pain with cramping and diarrhea.  She states her symptoms began 2 days ago.  They began with nasal congestion, cough, sore throat, and body aches.  She does not think that she had a fever and had not had chills.  Those symptoms lasted for 2 days.  Today she began having some crampy abdominal pain and had 4 episodes of loose stools.  These did not appear bloody or dark in color.  She is complaining of some diffuse abdominal pain feels it is worse on the right side.  Past Medical History:  Diagnosis Date  . Migraine   . Ovarian cancer [facility])     Patient Active Problem List   Diagnosis Date Noted  . Sinusitis, acute 12/07/2014    Past Surgical History:  Procedure Laterality Date  . ABDOMINAL HYSTERECTOMY    . HERNIA REPAIR       OB History   No obstetric history on file.      Home Medications    Prior to Admission medications   Medication Sig Start Date End Date Taking? Authorizing Provider  amoxicillin-clavulanate (AUGMENTIN XR) 1000-62.5 MG per tablet Take 2 tablets by mouth 2 (two) times daily. 12/07/14   Golden Circle, FNP  ESTRADIOL PO Take by mouth.    [provider]  fluticasone (FLONASE) 50 MCG/ACT nasal spray Place 2 sprays into both nostrils daily. 12/07/14   Golden Circle, FNP  ibuprofen (ADVIL,MOTRIN) 800 MG tablet Take 1 tablet (800 mg total) by mouth every 8 (eight) hours as needed. Needs office visit for more refills 02/04/16   Golden Circle, FNP  predniSONE (DELTASONE) 10 MG tablet Take 6 tablets x 1 day, 5 tablets x 1 day, 4 tablets x 1 day, 3 tablets x 1 day, 2 tablets x 1  day, 1 tablets x 1 day 12/07/14   Golden Circle, FNP    Family History Family History  Problem Relation Age of Onset  . Arthritis Mother   . Depression Mother   . Healthy Father   . Arthritis Maternal Grandmother   . Hypertension Maternal Grandmother     Social History Social History   Tobacco Use  . Smoking status: Former Smoker    Last attempt to quit: 09/27/2006    Years since quitting: 11.7  . Smokeless tobacco: Never Used  Substance Use Topics  . Alcohol use: No  . Drug use: No     Allergies   Neosporin [neomycin-bacitracin zn-polymyx]   Review of Systems Review of Systems  All other systems reviewed and are negative.    Physical Exam Updated Vital Signs BP (!) 130/94   Pulse (!) 58   Temp 97.8 F (36.6 C) (Oral)   Resp (!) 25   SpO2 100%   Physical Exam Vitals signs and nursing note reviewed.  Constitutional:      Appearance: She is well-developed.  HENT:     Head: Normocephalic.     Mouth/Throat:     Mouth: Mucous membranes are moist.  Eyes:     Extraocular Movements: Extraocular movements intact.  Cardiovascular:  Rate and Rhythm: Normal rate and regular rhythm.  Pulmonary:     Effort: Pulmonary effort is normal.     Breath sounds: Normal breath sounds.  Abdominal:     General: Abdomen is flat. Bowel sounds are normal.     Palpations: Abdomen is soft.     Tenderness: There is abdominal tenderness in the right upper quadrant and right lower quadrant.  Skin:    General: Skin is warm and dry.     Capillary Refill: Capillary refill takes less than 2 seconds.  Neurological:     General: No focal deficit present.     Mental Status: She is alert. She is disoriented.  Psychiatric:        Mood and Affect: Mood normal.      ED Treatments / Results  Labs (all labs ordered are listed, but only abnormal results are displayed) Labs Reviewed  COMPREHENSIVE METABOLIC PANEL - Abnormal; Notable for the following components:      Result  Value   Total Protein 6.4 (*)    Alkaline Phosphatase 32 (*)    All other components within normal limits  LIPASE, BLOOD  CBC  URINALYSIS, ROUTINE W REFLEX MICROSCOPIC  INFLUENZA PANEL BY PCR (TYPE A & B)    EKG EKG Interpretation  Date/Time:  Wednesday June 15 2018 22:11:17 EST Ventricular Rate:  81 PR Interval:    QRS Duration: 97 QT Interval:  446 QTC Calculation: 386 R Axis:   85 Text Interpretation:  Sinus rhythm Ventricular bigeminy Consider right atrial enlargement Probable left ventricular hypertrophy Baseline wander in lead(s) I III aVL aVF Confirmed by Pattricia Boss [phone]) on 06/15/2018 10:57:04 PM   Radiology Dg Chest 2 View  Result Date: 06/15/2018 CLINICAL DATA:  Cough EXAM: CHEST - 2 VIEW COMPARISON:  09/12/2016 FINDINGS: The heart size and mediastinal contours are within normal limits. Both lungs are clear. The visualized skeletal structures are unremarkable. IMPRESSION: No active cardiopulmonary disease. Electronically Signed   By: Donavan Foil M.D.   On: 06/15/2018 22:05   Ct Abdomen Pelvis W Contrast  Result Date: 06/15/2018 CLINICAL DATA:  RIGHT upper quadrant pain, nausea, vomiting and diarrhea. History of hysterectomy and hernia repair. EXAM: CT ABDOMEN AND PELVIS WITH CONTRAST TECHNIQUE: Multidetector CT imaging of the abdomen and pelvis was performed using the standard protocol following bolus administration of intravenous contrast. CONTRAST:  143mL OMNIPAQUE IOHEXOL 300 MG/ML  SOLN COMPARISON:  CT abdomen and pelvis Aug 30, 2014 FINDINGS: LOWER CHEST: Lung bases are clear. Included heart size is normal. No pericardial effusion. HEPATOBILIARY: Liver and gallbladder are normal. PANCREAS: Normal. SPLEEN: Normal. ADRENALS/URINARY TRACT: Kidneys are orthotopic, demonstrating symmetric enhancement. No nephrolithiasis, hydronephrosis or solid renal masses. The unopacified ureters are normal in course and caliber. Delayed imaging through the kidneys demonstrates symmetric  prompt contrast excretion within the proximal urinary collecting system. Urinary bladder is partially distended and unremarkable. Normal adrenal glands. STOMACH/BOWEL: The stomach, small and large bowel are normal in course and caliber without inflammatory changes. Normal appendix. VASCULAR/LYMPHATIC: Aortoiliac vessels are normal in course and caliber. Mild intimal thickening. No lymphadenopathy by CT size criteria. REPRODUCTIVE: Status post hysterectomy. OTHER: No intraperitoneal free fluid or free air. MUSCULOSKELETAL: Nonacute. Status post ventral abdominal wall herniorrhaphy with mesh. Small ventral hernia containing fat and a vessel without inflammatory changes, this is above the level of the mesh. IMPRESSION: 1. No acute intra-abdominal/pelvic process. 2. Interval anterior abdominal wall herniorrhaphy. Small ventral fat containing hernia. Electronically Signed   By: Elon Alas  M.D.   On: 06/15/2018 21:58    Procedures Procedures (including critical care time)  Medications Ordered in ED Medications  sodium chloride flush (NS) 0.9 % injection 3 mL (3 mLs Intravenous Given 06/15/18 2159)  sodium chloride 0.9 % bolus 1,000 mL (1,000 mLs Intravenous New Bag/Given 06/15/18 2159)  iohexol (OMNIPAQUE) 300 MG/ML solution 100 mL (100 mLs Intravenous Contrast Given 06/15/18 2132)     Initial Impression / Assessment and Plan / ED Course  I have reviewed the triage vital signs and the nursing notes.  Pertinent labs & imaging results that were available during my care of the patient were reviewed by me and considered in my medical decision making (see chart for details).        Appears hemodynamically stable here.  Chest x-Ramelo Oetken without acute infiltrates.  Patient refused flu test.  Her CT of her abdomen does not show any acute abnormalities.  She has not had any loose stools here and has had some IV fluids.  She has been taking p.o. without difficulty.  However, EKG was obtained and she is in  bigeminy.  Plan consult to cardiology.  Dr. Elson Areas saw for cardiology.  He advises patient can follow-up as outpatient.  I discussed return precautions and need for follow-up with patient she voices understanding.  Final Clinical Impressions(s) / ED Diagnoses   Final diagnoses:  Diarrhea, unspecified type  Viral infection  Bigeminy    ED Discharge Orders    None       Pattricia Boss, MD 06/16/18 [phone]

## 2018-06-16 LAB — URINALYSIS, ROUTINE W REFLEX MICROSCOPIC
Bilirubin Urine: NEGATIVE
Glucose, UA: NEGATIVE mg/dL
Ketones, ur: 5 mg/dL — AB
Leukocytes,Ua: NEGATIVE
Nitrite: NEGATIVE
Protein, ur: NEGATIVE mg/dL
Specific Gravity, Urine: >1.046 — ABNORMAL HIGH (ref 1.005–1.030)
pH: 5 (ref 5.0–8.0)

## 2018-06-16 NOTE — ED Notes (Signed)
Patient verbalizes understanding of discharge instructions. Opportunity for questioning and answers were provided. Armband removed by staff, pt discharged from ED.  

## 2018-06-16 NOTE — Discharge Instructions (Signed)
Please drink frequent fluids as discussed Call cardiology for follow-up Return if you are worse at any time.

## 2018-06-20 ENCOUNTER — Encounter: Payer: Self-pay | Admitting: Cardiology

## 2018-06-20 ENCOUNTER — Ambulatory Visit (INDEPENDENT_AMBULATORY_CARE_PROVIDER_SITE_OTHER): Payer: BLUE CROSS/BLUE SHIELD | Admitting: Cardiology

## 2018-06-20 VITALS — BP 120/62 | HR 60 | Ht 67.0 in | Wt 144.0 lb

## 2018-06-20 DIAGNOSIS — I493 Ventricular premature depolarization: Secondary | ICD-10-CM

## 2018-06-20 DIAGNOSIS — R0789 Other chest pain: Secondary | ICD-10-CM | POA: Diagnosis not present

## 2018-06-20 HISTORY — DX: Ventricular premature depolarization: I49.3

## 2018-06-20 NOTE — Patient Instructions (Signed)
Medication Instructions:  Your physician recommends that you continue on your current medications as directed. Please refer to the Current Medication list given to you today.  If you need a refill on your cardiac medications before your next appointment, please call your pharmacy.   Lab work: NONE If you have labs (blood work) drawn today and your tests are completely normal, you will receive your results only by: Marland Kitchen MyChart Message (if you have MyChart) OR . A paper copy in the mail If you have any lab test that is abnormal or we need to change your treatment, we will call you to review the results.  Testing/Procedures: Your physician has requested that you have a stress echocardiogram. For further information please visit [email or web address]. Please follow instruction sheet as given.     Follow-Up: At [facility], you and your health needs are our priority.  As part of our continuing mission to provide you with exceptional heart care, we have created designated Provider Care Teams.  These Care Teams include your primary Cardiologist (physician) and Advanced Practice Providers (APPs -  Physician Assistants and Nurse Practitioners) who all work together to provide you with the care you need, when you need it. You will need a follow up appointment in 4 months.   Any Other Special Instructions Will Be Listed Below    Exercise Stress Echocardiogram  An exercise stress echocardiogram is a test that checks how well your heart is working. For this test, you will walk on a treadmill to make your heart beat faster. This test uses sound waves (ultrasound) and a computer to make pictures (images) of your heart. These pictures will be taken before you exercise and after you exercise. What happens before the procedure?  Follow instructions from your doctor about what you cannot eat or drink before the test.  Do not drink or eat anything that has caffeine in it. Stop having caffeine for 24 hours  before the test.  Ask your doctor about changing or stopping your normal medicines. This is important if you take diabetes medicines or blood thinners. Ask your doctor if you should take your medicines with water before the test.  If you use an inhaler, bring it to the test.  Do not use any products that have nicotine or tobacco in them, such as cigarettes and e-cigarettes. Stop using them for 4 hours before the test. If you need help quitting, ask your doctor.  Wear comfortable shoes and clothing. What happens during the procedure?  You will be hooked up to a TV screen. Your doctor will watch the screen to see how fast your heart beats during the test.  Before you exercise, a computer will make a picture of your heart. To do this: ? A gel will be put on your chest. ? A wand will be moved over the gel. ? Sound waves from the wand will go to the computer to make the picture.  Your will start walking on a treadmill. The treadmill will start at a slow speed. It will get faster a little bit at a time. When you walk faster, your heart will beat faster.  The treadmill will be stopped when your heart is working hard.  You will lie down right away so another picture of your heart can be taken.  The test will take 30-60 minutes. What happens after the procedure?  Your heart rate and blood pressure will be watched after the test.  If your doctor says that you can, you  may: ? Eat what you usually eat. ? Do your normal activities. ? Take medicines like normal. Summary  An exercise stress echocardiogram is a test that checks how well your heart is working.  Follow instructions about what you cannot eat or drink before the test. Ask your doctor if you should take your normal medicines before the test.  Stop having caffeine for 24 hours before the test. Do not use anything with nicotine or tobacco in it for 4 hours before the test.  A computer will take a picture of your heart before you  walk on a treadmill. It will take another picture when you are done walking.  Your heart rate and blood pressure will be watched after the test. This information is not intended to replace advice given to you by your health care provider. Make sure you discuss any questions you have with your health care provider. Document Released: 01/25/2009 Document Revised: 12/22/2015 Document Reviewed: 12/22/2015 Elsevier Interactive Patient Education  2019 [employer].

## 2018-06-20 NOTE — Progress Notes (Signed)
Cardiology Office Note:    Date:  06/20/2018   ID:  Cynthia Wang, DOB Jun 18, 1970, MRN [id number]  PCP:  Azucena Fallen, MD  Cardiologist:  Jenean Lindau, MD   Referring MD: Azucena Fallen, MD    ASSESSMENT:    1. Chest discomfort   2. Frequent PVCs    PLAN:    In order of problems listed above:  1. Primary prevention stressed with the patient.  Importance of compliance with diet and medication stressed and she vocalized understanding.  Her blood pressure is stable.  Diet was discussed for dyslipidemia of which she says she has been told of mild lipid elevation. 2. In view of her chest discomfort she will have a exercise stress echo.  Her symptoms are atypical for coronary etiology. 3. Patient will be seen in follow-up appointment in 6 months or earlier if the patient has any concerns    Medication Adjustments/Labs and Tests Ordered: Current medicines are reviewed at length with the patient today.  Concerns regarding medicines are outlined above.  Orders Placed This Encounter  Procedures  . ECHOCARDIOGRAM STRESS TEST   No orders of the defined types were placed in this encounter.    History of Present Illness:    Cynthia Wang is a 48 y.o. female who is being seen today for the evaluation of abnormal EKG, chest discomfort and frequent PVCs at the request of Azucena Fallen, MD.  Patient is a pleasant 48 year old female.  She has no past medical history of hypertension dyslipidemia or diabetes mellitus.  She is a smoker but has quit long time ago and now uses weight.  She mentions to me that she feels a sensation of heartbeat at times and ringing in her ear.  She was found to have frequent PVCs and sent here for evaluation.  She has occasional chest discomfort and this is not related to exertion.  No orthopnea or PND.  No radiation of the symptoms to the neck or to the arms.  She is an active lady.  She does not exercise on a regular basis.  At the time of my evaluation, the  patient is alert awake oriented and in no distress.  Past Medical History:  Diagnosis Date  . Migraine   . Ovarian cancer [facility])     Past Surgical History:  Procedure Laterality Date  . ABDOMINAL HYSTERECTOMY    . HERNIA REPAIR      Current Medications: Current Meds  Medication Sig  . ALPRAZolam (XANAX) 1 MG tablet Take 1 mg by mouth 2 (two) times daily as needed for anxiety.  . fluticasone (FLONASE) 50 MCG/ACT nasal spray Place 2 sprays into both nostrils daily.  Marland Kitchen HYDROcodone-acetaminophen (NORCO) 10-325 MG tablet Take 1 tablet by mouth every 6 (six) hours as needed for moderate pain.  Marland Kitchen ibuprofen (ADVIL,MOTRIN) 800 MG tablet Take 1 tablet (800 mg total) by mouth every 8 (eight) hours as needed. Needs office visit for more refills     Allergies:   Neosporin [neomycin-bacitracin zn-polymyx]   Social History   Socioeconomic History  . Marital status: Married    Spouse name: Not on file  . Number of children: 2  . Years of education: 78  . Highest education level: Not on file  Occupational History  . Occupation: [employer]  . Financial resource strain: Not on file  . Food insecurity:    Worry: Not on file    Inability: Not on file  . Transportation needs:  Medical: Not on file    Non-medical: Not on file  Tobacco Use  . Smoking status: Former Smoker    Last attempt to quit: 09/27/2006    Years since quitting: 11.7  . Smokeless tobacco: Never Used  Substance and Sexual Activity  . Alcohol use: No  . Drug use: No  . Sexual activity: Yes    Birth control/protection: Surgical  Lifestyle  . Physical activity:    Days per week: Not on file    Minutes per session: Not on file  . Stress: Not on file  Relationships  . Social connections:    Talks on phone: Not on file    Gets together: Not on file    Attends religious service: Not on file    Active member of club or organization: Not on file    Attends meetings of clubs or organizations: Not  on file    Relationship status: Not on file  Other Topics Concern  . Not on file  Social History Narrative   Fun: Watching softball game   Denies religious beliefs effecting health care.   Feels safe at home and denies abuse.      Family History: The patient's family history includes Arthritis in her maternal grandmother and mother; Depression in her mother; Healthy in her father; Hypertension in her maternal grandmother.  ROS:   Please see the history of present illness.    All other systems reviewed and are negative.  EKGs/Labs/Other Studies Reviewed:    The following studies were reviewed today: EKG reveals sinus rhythm and frequent PVCs.   Recent Labs: 06/15/2018: ALT 13; BUN 12; Creatinine, Ser 0.78; Hemoglobin 13.2; Platelets 251; Potassium 3.8; Sodium 141  Recent Lipid Panel No results found for: CHOL, TRIG, HDL, CHOLHDL, VLDL, LDLCALC, LDLDIRECT  Physical Exam:    VS:  BP 120/62 (BP Location: Right Arm, Patient Position: Sitting, Cuff Size: Normal)   Pulse 60   Ht 5\' 7"  (1.702 m)   Wt 144 lb (65.3 kg)   SpO2 98%   BMI 22.55 kg/m     Wt Readings from Last 3 Encounters:  06/20/18 144 lb (65.3 kg)  09/12/16 138 lb (62.6 kg)  12/07/14 140 lb (63.5 kg)     GEN: Patient is in no acute distress HEENT: Normal NECK: No JVD; No carotid bruits LYMPHATICS: No lymphadenopathy CARDIAC: S1 S2 regular, 2/6 systolic murmur at the apex. RESPIRATORY:  Clear to auscultation without rales, wheezing or rhonchi  ABDOMEN: Soft, non-tender, non-distended MUSCULOSKELETAL:  No edema; No deformity  SKIN: Warm and dry NEUROLOGIC:  Alert and oriented x 3 PSYCHIATRIC:  Normal affect    Signed, Jenean Lindau, MD  06/20/2018 2:07 PM    [place]

## 2018-06-28 ENCOUNTER — Other Ambulatory Visit (HOSPITAL_BASED_OUTPATIENT_CLINIC_OR_DEPARTMENT_OTHER): Payer: Self-pay

## 2018-07-04 ENCOUNTER — Telehealth: Payer: Self-pay | Admitting: Cardiology

## 2018-07-04 NOTE — Telephone Encounter (Signed)
Rescheduled Echo from 07/08/2018 to 09/06/2018 at 8:15 due to [place].  Left VM with new date and time

## 2018-07-08 ENCOUNTER — Ambulatory Visit (HOSPITAL_BASED_OUTPATIENT_CLINIC_OR_DEPARTMENT_OTHER): Payer: BLUE CROSS/BLUE SHIELD

## 2018-09-06 ENCOUNTER — Other Ambulatory Visit (HOSPITAL_BASED_OUTPATIENT_CLINIC_OR_DEPARTMENT_OTHER): Payer: BLUE CROSS/BLUE SHIELD

## 2018-09-06 ENCOUNTER — Encounter (HOSPITAL_BASED_OUTPATIENT_CLINIC_OR_DEPARTMENT_OTHER): Payer: Self-pay

## 2018-10-20 ENCOUNTER — Ambulatory Visit: Payer: BLUE CROSS/BLUE SHIELD | Admitting: Cardiology

## 2018-11-04 ENCOUNTER — Ambulatory Visit (INDEPENDENT_AMBULATORY_CARE_PROVIDER_SITE_OTHER): Payer: BLUE CROSS/BLUE SHIELD | Admitting: Cardiology

## 2018-11-04 ENCOUNTER — Encounter: Payer: Self-pay | Admitting: Cardiology

## 2018-11-04 ENCOUNTER — Other Ambulatory Visit: Payer: Self-pay

## 2018-11-04 VITALS — BP 112/66 | HR 66 | Ht 67.0 in | Wt 144.0 lb

## 2018-11-04 DIAGNOSIS — I493 Ventricular premature depolarization: Secondary | ICD-10-CM | POA: Diagnosis not present

## 2018-11-04 DIAGNOSIS — Z13220 Encounter for screening for lipoid disorders: Secondary | ICD-10-CM | POA: Insufficient documentation

## 2018-11-04 DIAGNOSIS — R011 Cardiac murmur, unspecified: Secondary | ICD-10-CM

## 2018-11-04 DIAGNOSIS — R0789 Other chest pain: Secondary | ICD-10-CM

## 2018-11-04 DIAGNOSIS — Z1329 Encounter for screening for other suspected endocrine disorder: Secondary | ICD-10-CM

## 2018-11-04 HISTORY — DX: Encounter for screening for lipoid disorders: Z13.220

## 2018-11-04 HISTORY — DX: Cardiac murmur, unspecified: R01.1

## 2018-11-04 HISTORY — DX: Other chest pain: R07.89

## 2018-11-04 NOTE — Addendum Note (Signed)
Addended by: Beckey Rutter on: 11/04/2018 11:20 AM   Modules accepted: Orders

## 2018-11-04 NOTE — Progress Notes (Signed)
Cardiology Office Note:    Date:  11/04/2018   ID:  Cynthia Wang, DOB May 24, 1970, MRN [id number]  PCP:  Azucena Fallen, MD  Cardiologist:  Jenean Lindau, MD   Referring MD: Azucena Fallen, MD    ASSESSMENT:    1. Chest discomfort   2. Cardiac murmur    PLAN:    In order of problems listed above:  1. I discussed my findings with the patient at extensive length.  Her chest discomfort has resolved and she is very happy about it.  She is now on an exercise plan. 2. We will have blood work done today to check her electrolytes and also lipids for primary prevention and screening.  She does not have a primary care physician and I urged her to get established with 1. 3. Echocardiogram will be done to assess murmur heard on auscultation 4. Patient will be seen in follow-up appointment in 6 months or earlier if the patient has any concerns    Medication Adjustments/Labs and Tests Ordered: Current medicines are reviewed at length with the patient today.  Concerns regarding medicines are outlined above.  No orders of the defined types were placed in this encounter.  No orders of the defined types were placed in this encounter.    No chief complaint on file.    History of Present Illness:    Cynthia Wang is a 48 y.o. female.  Patient has past medical history of ovarian cancer.  She was evaluated for skipped beat sensation and chest discomfort.  I had assured her last time that her symptoms were atypical and she is happy about it.  These symptoms of chest discomfort have resolved.  No chest pain orthopnea or PND.  She walks 3 miles a day about 2-3 times a week without any problems.  At the time of my evaluation, the patient is alert awake oriented and in no distress.  Past Medical History:  Diagnosis Date  . Migraine   . Ovarian cancer [facility])     Past Surgical History:  Procedure Laterality Date  . ABDOMINAL HYSTERECTOMY    . HERNIA REPAIR      Current Medications:  Current Meds  Medication Sig  . ALPRAZolam (XANAX) 1 MG tablet Take 1 mg by mouth 2 (two) times daily as needed for anxiety.  Marland Kitchen HYDROcodone-acetaminophen (NORCO) 10-325 MG tablet Take 1 tablet by mouth every 6 (six) hours as needed for moderate pain.  Marland Kitchen ibuprofen (ADVIL,MOTRIN) 800 MG tablet Take 1 tablet (800 mg total) by mouth every 8 (eight) hours as needed. Needs office visit for more refills     Allergies:   Neosporin [neomycin-bacitracin zn-polymyx]   Social History   Socioeconomic History  . Marital status: Married    Spouse name: Not on file  . Number of children: 2  . Years of education: 91  . Highest education level: Not on file  Occupational History  . Occupation: [employer]  . Financial resource strain: Not on file  . Food insecurity    Worry: Not on file    Inability: Not on file  . Transportation needs    Medical: Not on file    Non-medical: Not on file  Tobacco Use  . Smoking status: Former Smoker    Quit date: 09/27/2006    Years since quitting: 12.1  . Smokeless tobacco: Never Used  Substance and Sexual Activity  . Alcohol use: No  . Drug use: No  . Sexual activity: Yes  Birth control/protection: Surgical  Lifestyle  . Physical activity    Days per week: Not on file    Minutes per session: Not on file  . Stress: Not on file  Relationships  . Social Herbalist on phone: Not on file    Gets together: Not on file    Attends religious service: Not on file    Active member of club or organization: Not on file    Attends meetings of clubs or organizations: Not on file    Relationship status: Not on file  Other Topics Concern  . Not on file  Social History Narrative   Fun: Watching softball game   Denies religious beliefs effecting health care.   Feels safe at home and denies abuse.      Family History: The patient's family history includes Arthritis in her maternal grandmother and mother; Depression in her mother;  Healthy in her father; Hypertension in her maternal grandmother.  ROS:   Please see the history of present illness.    All other systems reviewed and are negative.  EKGs/Labs/Other Studies Reviewed:    The following studies were reviewed today: As mentioned above   Recent Labs: 06/15/2018: ALT 13; BUN 12; Creatinine, Ser 0.78; Hemoglobin 13.2; Platelets 251; Potassium 3.8; Sodium 141  Recent Lipid Panel No results found for: CHOL, TRIG, HDL, CHOLHDL, VLDL, LDLCALC, LDLDIRECT  Physical Exam:    VS:  BP 112/66 (BP Location: Left Arm, Patient Position: Sitting, Cuff Size: Normal)   Pulse 66   Ht 5\' 7"  (1.702 m)   Wt 144 lb (65.3 kg)   SpO2 98%   BMI 22.55 kg/m     Wt Readings from Last 3 Encounters:  11/04/18 144 lb (65.3 kg)  06/20/18 144 lb (65.3 kg)  09/12/16 138 lb (62.6 kg)     GEN: Patient is in no acute distress HEENT: Normal NECK: No JVD; No carotid bruits LYMPHATICS: No lymphadenopathy CARDIAC: Hear sounds regular, 2/6 systolic murmur at the apex. RESPIRATORY:  Clear to auscultation without rales, wheezing or rhonchi  ABDOMEN: Soft, non-tender, non-distended MUSCULOSKELETAL:  No edema; No deformity  SKIN: Warm and dry NEUROLOGIC:  Alert and oriented x 3 PSYCHIATRIC:  Normal affect   Signed, Jenean Lindau, MD  11/04/2018 11:12 AM    [place]

## 2018-11-04 NOTE — Patient Instructions (Addendum)
Medication Instructions:  Your physician recommends that you continue on your current medications as directed. Please refer to the Current Medication list given to you today.  If you need a refill on your cardiac medications before your next appointment, please call your pharmacy.   Lab work: Your physician recommends that you have a BMP, CBC, TSH, hepatic and lipid drawn today  If you have labs (blood work) drawn today and your tests are completely normal, you will receive your results only by: Marland Kitchen MyChart Message (if you have MyChart) OR . A paper copy in the mail If you have any lab test that is abnormal or we need to change your treatment, we will call you to review the results.  Testing/Procedures: Your physician has requested that you have an echocardiogram. Echocardiography is a painless test that uses sound waves to create images of your heart. It provides your doctor with information about the size and shape of your heart and how well your heart's chambers and valves are working. This procedure takes approximately one hour. There are no restrictions for this procedure.    Follow-Up: At [facility], you and your health needs are our priority.  As part of our continuing mission to provide you with exceptional heart care, we have created designated Provider Care Teams.  These Care Teams include your primary Cardiologist (physician) and Advanced Practice Providers (APPs -  Physician Assistants and Nurse Practitioners) who all work together to provide you with the care you need, when you need it.  YOU WILL NEED TO follow up with Dr. Geraldo Pitter in 6 mo in the [facility] office    Echocardiogram An echocardiogram is a procedure that uses painless sound waves (ultrasound) to produce an image of the heart. Images from an echocardiogram can provide important information about:  Signs of coronary artery disease (CAD).  Aneurysm detection. An aneurysm is a weak or damaged part of an artery wall  that bulges out from the normal force of blood pumping through the body.  Heart size and shape. Changes in the size or shape of the heart can be associated with certain conditions, including heart failure, aneurysm, and CAD.  Heart muscle function.  Heart valve function.  Signs of a past heart attack.  Fluid buildup around the heart.  Thickening of the heart muscle.  A tumor or infectious growth around the heart valves. Tell a health care provider about:  Any allergies you have.  All medicines you are taking, including vitamins, herbs, eye drops, creams, and over-the-counter medicines.  Any blood disorders you have.  Any surgeries you have had.  Any medical conditions you have.  Whether you are pregnant or may be pregnant. What are the risks? Generally, this is a safe procedure. However, problems may occur, including:  Allergic reaction to dye (contrast) that may be used during the procedure. What happens before the procedure? No specific preparation is needed. You may eat and drink normally. What happens during the procedure?   An IV tube may be inserted into one of your veins.  You may receive contrast through this tube. A contrast is an injection that improves the quality of the pictures from your heart.  A gel will be applied to your chest.  A wand-like tool (transducer) will be moved over your chest. The gel will help to transmit the sound waves from the transducer.  The sound waves will harmlessly bounce off of your heart to allow the heart images to be captured in real-time motion. The  images will be recorded on a computer. The procedure may vary among health care providers and hospitals. What happens after the procedure?  You may return to your normal, everyday life, including diet, activities, and medicines, unless your health care provider tells you not to do that. Summary  An echocardiogram is a procedure that uses painless sound waves (ultrasound) to  produce an image of the heart.  Images from an echocardiogram can provide important information about the size and shape of your heart, heart muscle function, heart valve function, and fluid buildup around your heart.  You do not need to do anything to prepare before this procedure. You may eat and drink normally.  After the echocardiogram is completed, you may return to your normal, everyday life, unless your health care provider tells you not to do that. This information is not intended to replace advice given to you by your health care provider. Make sure you discuss any questions you have with your health care provider. Document Released: 03/27/2000 Document Revised: 07/21/2018 Document Reviewed: 05/02/2016 Elsevier Patient Education  2020 [employer].

## 2018-11-05 LAB — BASIC METABOLIC PANEL
BUN/Creatinine Ratio: 16 (ref 9–23)
BUN: 14 mg/dL (ref 6–24)
CO2: 23 mmol/L (ref 20–29)
Calcium: 9.5 mg/dL (ref 8.7–10.2)
Chloride: 107 mmol/L — ABNORMAL HIGH (ref 96–106)
Creatinine, Ser: 0.87 mg/dL (ref 0.57–1.00)
GFR calc Af Amer: 90 mL/min/{1.73_m2} (ref >59)
GFR calc non Af Amer: 78 mL/min/{1.73_m2} (ref >59)
Glucose: 86 mg/dL (ref 65–99)
Potassium: 4.9 mmol/L (ref 3.5–5.2)
Sodium: 143 mmol/L (ref 134–144)

## 2018-11-05 LAB — CBC
Hematocrit: 39 % (ref 34.0–46.6)
Hemoglobin: 13.5 g/dL (ref 11.1–15.9)
MCH: 31.3 pg (ref 26.6–33.0)
MCHC: 34.6 g/dL (ref 31.5–35.7)
MCV: 91 fL (ref 79–97)
Platelets: 252 10*3/uL (ref 150–450)
RBC: 4.31 x10E6/uL (ref 3.77–5.28)
RDW: 12 % (ref 11.7–15.4)
WBC: 4.9 10*3/uL (ref 3.4–10.8)

## 2018-11-05 LAB — LIPID PANEL
Chol/HDL Ratio: 2.9 ratio (ref 0.0–4.4)
Cholesterol, Total: 197 mg/dL (ref 100–199)
HDL: 67 mg/dL (ref >39)
LDL Calculated: 119 mg/dL — ABNORMAL HIGH (ref 0–99)
Triglycerides: 57 mg/dL (ref 0–149)
VLDL Cholesterol Cal: 11 mg/dL (ref 5–40)

## 2018-11-05 LAB — HEPATIC FUNCTION PANEL
ALT: 15 IU/L (ref 0–32)
AST: 19 IU/L (ref 0–40)
Albumin: 4.4 g/dL (ref 3.8–4.8)
Alkaline Phosphatase: 39 IU/L (ref 39–117)
Bilirubin Total: 0.4 mg/dL (ref 0.0–1.2)
Bilirubin, Direct: 0.11 mg/dL (ref 0.00–0.40)
Total Protein: 6.7 g/dL (ref 6.0–8.5)

## 2018-11-05 LAB — TSH: TSH: 0.552 u[IU]/mL (ref 0.450–4.500)

## 2018-11-10 ENCOUNTER — Telehealth: Payer: Self-pay

## 2018-11-10 NOTE — Telephone Encounter (Signed)
-----   Message from Jenean Lindau, MD sent at 11/08/2018  9:59 AM EDT ----- The results of the study is unremarkable. Please inform patient. I will discuss in detail at next appointment. Cc  primary care/referring physician Jenean Lindau, MD 11/08/2018 9:59 AM

## 2018-11-10 NOTE — Telephone Encounter (Signed)
Information relayed, copy sent to Dr. Benjie Karvonen per Dr. Docia Furl request.

## 2018-12-13 ENCOUNTER — Other Ambulatory Visit: Payer: Self-pay | Admitting: Obstetrics & Gynecology

## 2018-12-13 DIAGNOSIS — E8941 Symptomatic postprocedural ovarian failure: Secondary | ICD-10-CM

## 2019-01-05 ENCOUNTER — Other Ambulatory Visit: Payer: Self-pay

## 2019-01-05 ENCOUNTER — Ambulatory Visit (INDEPENDENT_AMBULATORY_CARE_PROVIDER_SITE_OTHER): Payer: BLUE CROSS/BLUE SHIELD

## 2019-01-05 DIAGNOSIS — R011 Cardiac murmur, unspecified: Secondary | ICD-10-CM | POA: Diagnosis not present

## 2019-01-05 NOTE — Progress Notes (Signed)
Complete echocardiogram has been performed.  Jimmy Helen Cuff RDCS, RVT 

## 2019-01-09 ENCOUNTER — Telehealth: Payer: Self-pay

## 2019-01-09 NOTE — Telephone Encounter (Signed)
Mailbox full unable to leave vm. Copy of labs sent to Dr. Benjie Karvonen

## 2019-01-09 NOTE — Telephone Encounter (Signed)
-----   Message from Rajan R Revankar, MD sent at 01/06/2019 11:27 AM EDT ----- The results of the study is unremarkable. Please inform patient. I will discuss in detail at next appointment. Cc  primary care/referring physician Rajan R Revankar, MD 01/06/2019 11:27 AM 

## 2019-01-09 NOTE — Telephone Encounter (Signed)
Information relayed no further questions.  

## 2019-02-24 ENCOUNTER — Other Ambulatory Visit: Payer: BLUE CROSS/BLUE SHIELD

## 2019-04-28 ENCOUNTER — Ambulatory Visit: Payer: BLUE CROSS/BLUE SHIELD | Admitting: Cardiology

## 2019-06-09 ENCOUNTER — Other Ambulatory Visit: Payer: Self-pay

## 2019-06-09 ENCOUNTER — Ambulatory Visit (INDEPENDENT_AMBULATORY_CARE_PROVIDER_SITE_OTHER): Payer: Self-pay | Admitting: Cardiology

## 2019-06-09 ENCOUNTER — Encounter: Payer: Self-pay | Admitting: Cardiology

## 2019-06-09 VITALS — BP 100/78 | HR 74 | Ht 67.0 in | Wt 148.0 lb

## 2019-06-09 DIAGNOSIS — I493 Ventricular premature depolarization: Secondary | ICD-10-CM

## 2019-06-09 NOTE — Patient Instructions (Signed)
Medication Instructions:  No medication changes. *If you need a refill on your cardiac medications before your next appointment, please call your pharmacy*   Lab Work: None ordered If you have labs (blood work) drawn today and your tests are completely normal, you will receive your results only by: . MyChart Message (if you have MyChart) OR . A paper copy in the mail If you have any lab test that is abnormal or we need to change your treatment, we will call you to review the results.   Testing/Procedures: None ordered   Follow-Up: At [facility], you and your health needs are our priority.  As part of our continuing mission to provide you with exceptional heart care, we have created designated Provider Care Teams.  These Care Teams include your primary Cardiologist (physician) and Advanced Practice Providers (APPs -  Physician Assistants and Nurse Practitioners) who all work together to provide you with the care you need, when you need it.  We recommend signing up for the patient portal called "MyChart".  Sign up information is provided on this After Visit Summary.  MyChart is used to connect with patients for Virtual Visits (Telemedicine).  Patients are able to view lab/test results, encounter notes, upcoming appointments, etc.  Non-urgent messages can be sent to your provider as well.   To learn more about what you can do with MyChart, go to [email or web address].com.    Your next appointment:   1 year(s)  The format for your next appointment:   In Person  Provider:   Rajan Revankar, MD   Other Instructions NA  

## 2019-06-09 NOTE — Progress Notes (Signed)
Cardiology Office Note:    Date:  06/09/2019   ID:  Cynthia Wang, DOB 10/23/1970, MRN [id number]  PCP:  Azucena Fallen, MD  Cardiologist:  Jenean Lindau, MD   Referring MD: Azucena Fallen, MD    ASSESSMENT:    1. Frequent PVCs    PLAN:    In order of problems listed above:  1. Frequent PVCs: I discussed my findings with the patient at length.  This is stable at this time.  I would not initiate on a beta-blocker because her blood pressure is borderline.  Lipids were discussed with her at length and I told her to diet and exercise regularly and she promises to do so.  She patient follow-up appointment in annual basis or earlier if she has any concerns.  Patient's questions were answered to her satisfaction.   Medication Adjustments/Labs and Tests Ordered: Current medicines are reviewed at length with the patient today.  Concerns regarding medicines are outlined above.  No orders of the defined types were placed in this encounter.  No orders of the defined types were placed in this encounter.    Chief Complaint  Patient presents with  . Follow-up     History of Present Illness:    Cynthia Wang is a 49 y.o. female.  Patient has past medical history of PVCs.  She denies any problems at this time and takes care of activities of daily living.  No chest pain orthopnea or PND.  She exercises on a regular basis.  At the time of my evaluation, the patient is alert awake oriented and in no distress.  Past Medical History:  Diagnosis Date  . Migraine   . Ovarian cancer [facility])     Past Surgical History:  Procedure Laterality Date  . ABDOMINAL HYSTERECTOMY    . HERNIA REPAIR      Current Medications: Current Meds  Medication Sig  . ALPRAZolam (XANAX) 1 MG tablet Take 1 mg by mouth 2 (two) times daily as needed for anxiety.  Marland Kitchen estradiol (VIVELLE-DOT) 0.1 MG/24HR patch 1 patch 2 (two) times a week.  Marland Kitchen HYDROcodone-acetaminophen (NORCO) 10-325 MG tablet Take 1 tablet by  mouth every 6 (six) hours as needed for moderate pain.  Marland Kitchen ibuprofen (ADVIL,MOTRIN) 800 MG tablet Take 1 tablet (800 mg total) by mouth every 8 (eight) hours as needed. Needs office visit for more refills  . levocetirizine (XYZAL) 5 MG tablet Take 5 mg by mouth at bedtime.  . Vitamin D, Ergocalciferol, (DRISDOL) 1.25 MG (50000 UNIT) CAPS capsule Take 50,000 Units by mouth once a week.     Allergies:   Neosporin [neomycin-bacitracin zn-polymyx]   Social History   Socioeconomic History  . Marital status: Married    Spouse name: Not on file  . Number of children: 2  . Years of education: 50  . Highest education level: Not on file  Occupational History  . Occupation: Engineer, production  Tobacco Use  . Smoking status: Former Smoker    Quit date: 09/27/2006    Years since quitting: 12.7  . Smokeless tobacco: Never Used  Substance and Sexual Activity  . Alcohol use: No  . Drug use: No  . Sexual activity: Yes    Birth control/protection: Surgical  Other Topics Concern  . Not on file  Social History Narrative   Fun: Watching softball game   Denies religious beliefs effecting health care.   Feels safe at home and denies abuse.    Social Determinants of Health   Financial  Resource Strain:   . Difficulty of Paying Living Expenses: Not on file  Food Insecurity:   . Worried About Charity fundraiser in the Last Year: Not on file  . Ran Out of Food in the Last Year: Not on file  Transportation Needs:   . Lack of Transportation (Medical): Not on file  . Lack of Transportation (Non-Medical): Not on file  Physical Activity:   . Days of Exercise per Week: Not on file  . Minutes of Exercise per Session: Not on file  Stress:   . Feeling of Stress : Not on file  Social Connections:   . Frequency of Communication with Friends and Family: Not on file  . Frequency of Social Gatherings with Friends and Family: Not on file  . Attends Religious Services: Not on file  . Active Member of Clubs  or Organizations: Not on file  . Attends Archivist Meetings: Not on file  . Marital Status: Not on file     Family History: The patient's family history includes Arthritis in her maternal grandmother and mother; Depression in her mother; Healthy in her father; Hypertension in her maternal grandmother.  ROS:   Please see the history of present illness.    All other systems reviewed and are negative.  EKGs/Labs/Other Studies Reviewed:    The following studies were reviewed today: I discussed my findings with the patient at length including lipids   Recent Labs: 11/04/2018: ALT 15; BUN 14; Creatinine, Ser 0.87; Hemoglobin 13.5; Platelets 252; Potassium 4.9; Sodium 143; TSH 0.552  Recent Lipid Panel    Component Value Date/Time   CHOL 197 11/04/2018 1129   TRIG 57 11/04/2018 1129   HDL 67 11/04/2018 1129   CHOLHDL 2.9 11/04/2018 1129   LDLCALC 119 (H) 11/04/2018 1129    Physical Exam:    VS:  BP 100/78   Pulse 74   Ht 5\' 7"  (1.702 m)   Wt 148 lb (67.1 kg)   SpO2 99%   BMI 23.18 kg/m     Wt Readings from Last 3 Encounters:  06/09/19 148 lb (67.1 kg)  11/04/18 144 lb (65.3 kg)  06/20/18 144 lb (65.3 kg)     GEN: Patient is in no acute distress HEENT: Normal NECK: No JVD; No carotid bruits LYMPHATICS: No lymphadenopathy CARDIAC: Hear sounds regular, 2/6 systolic murmur at the apex. RESPIRATORY:  Clear to auscultation without rales, wheezing or rhonchi  ABDOMEN: Soft, non-tender, non-distended MUSCULOSKELETAL:  No edema; No deformity  SKIN: Warm and dry NEUROLOGIC:  Alert and oriented x 3 PSYCHIATRIC:  Normal affect   Signed, Jenean Lindau, MD  06/09/2019 4:03 PM    [facility]

## 2020-02-23 ENCOUNTER — Other Ambulatory Visit: Payer: Self-pay | Admitting: Obstetrics & Gynecology

## 2020-02-23 DIAGNOSIS — R928 Other abnormal and inconclusive findings on diagnostic imaging of breast: Secondary | ICD-10-CM

## 2020-03-06 ENCOUNTER — Other Ambulatory Visit: Payer: Self-pay

## 2020-03-06 ENCOUNTER — Ambulatory Visit
Admission: RE | Admit: 2020-03-06 | Discharge: 2020-03-06 | Disposition: A | Discharge status: Home / Self Care | Payer: 59 | Source: Ambulatory Visit | Attending: Obstetrics & Gynecology | Admitting: Obstetrics & Gynecology

## 2020-03-06 DIAGNOSIS — R928 Other abnormal and inconclusive findings on diagnostic imaging of breast: Secondary | ICD-10-CM

## 2020-07-17 DIAGNOSIS — C569 Malignant neoplasm of unspecified ovary: Secondary | ICD-10-CM | POA: Insufficient documentation

## 2020-07-17 DIAGNOSIS — Z1211 Encounter for screening for malignant neoplasm of colon: Secondary | ICD-10-CM | POA: Insufficient documentation

## 2020-07-17 DIAGNOSIS — K5904 Chronic idiopathic constipation: Secondary | ICD-10-CM | POA: Insufficient documentation

## 2020-07-17 DIAGNOSIS — R140 Abdominal distension (gaseous): Secondary | ICD-10-CM

## 2020-07-17 DIAGNOSIS — G43909 Migraine, unspecified, not intractable, without status migrainosus: Secondary | ICD-10-CM | POA: Insufficient documentation

## 2020-07-17 HISTORY — DX: Chronic idiopathic constipation: K59.04

## 2020-07-17 HISTORY — DX: Abdominal distension (gaseous): R14.0

## 2020-07-17 HISTORY — DX: Encounter for screening for malignant neoplasm of colon: Z12.11

## 2020-07-18 ENCOUNTER — Ambulatory Visit (INDEPENDENT_AMBULATORY_CARE_PROVIDER_SITE_OTHER): Payer: 59

## 2020-07-18 ENCOUNTER — Encounter: Payer: Self-pay | Admitting: Cardiology

## 2020-07-18 ENCOUNTER — Other Ambulatory Visit: Payer: Self-pay

## 2020-07-18 ENCOUNTER — Ambulatory Visit (INDEPENDENT_AMBULATORY_CARE_PROVIDER_SITE_OTHER): Payer: 59 | Admitting: Cardiology

## 2020-07-18 VITALS — BP 118/68 | HR 85 | Resp 18 | Ht 67.0 in | Wt 145.3 lb

## 2020-07-18 DIAGNOSIS — Z13220 Encounter for screening for lipoid disorders: Secondary | ICD-10-CM | POA: Diagnosis not present

## 2020-07-18 DIAGNOSIS — R0789 Other chest pain: Secondary | ICD-10-CM | POA: Diagnosis not present

## 2020-07-18 DIAGNOSIS — I493 Ventricular premature depolarization: Secondary | ICD-10-CM

## 2020-07-18 NOTE — Patient Instructions (Signed)
Medication Instructions:  No medication changes. *If you need a refill on your cardiac medications before your next appointment, please call your pharmacy*   Lab Work: Your physician recommends that you have labs done in the office today. Your test included  basic metabolic panel, complete blood count, TSH, liver function and lipids.  If you have labs (blood work) drawn today and your tests are completely normal, you will receive your results only by: Marland Kitchen MyChart Message (if you have MyChart) OR . A paper copy in the mail If you have any lab test that is abnormal or we need to change your treatment, we will call you to review the results.   Testing/Procedures:  WHY IS MY DOCTOR PRESCRIBING ZIO? The Zio system is proven and trusted by physicians to detect and diagnose irregular heart rhythms -- and has been prescribed to hundreds of thousands of patients.  The FDA has cleared the Zio system to monitor for many different kinds of irregular heart rhythms. In a study, physicians were able to reach a diagnosis 90% of the time with the Zio system1.  You can wear the Zio monitor -- a small, discreet, comfortable patch -- during your normal day-to-day activity, including while you sleep, shower, and exercise, while it records every single heartbeat for analysis.  1Barrett, P., et al. Comparison of 24 Hour Holter Monitoring Versus 14 Day Novel Adhesive Patch Electrocardiographic Monitoring. [place], 2014.  ZIO VS. HOLTER MONITORING The Zio monitor can be comfortably worn for up to 14 days. Holter monitors can be worn for 24 to 48 hours, limiting the time to record any irregular heart rhythms you may have. Zio is able to capture data for the 51% of patients who have their first symptom-triggered arrhythmia after 48 hours.1  LIVE WITHOUT RESTRICTIONS The Zio ambulatory cardiac monitor is a small, unobtrusive, and water-resistant patch--you might even forget you're wearing it. The  Zio monitor records and stores every beat of your heart, whether you're sleeping, working out, or showering.  Wear the monitor for 14 days, remove 08/01/20.  We will order CT coronary calcium score. It will cost $99.00 and is not covered by insurance.  Please call [phone] to schedule.   [facility]  [address], [place] [address]    Follow-Up: At [facility], you and your health needs are our priority.  As part of our continuing mission to provide you with exceptional heart care, we have created designated Provider Care Teams.  These Care Teams include your primary Cardiologist (physician) and Advanced Practice Providers (APPs -  Physician Assistants and Nurse Practitioners) who all work together to provide you with the care you need, when you need it.  We recommend signing up for the patient portal called "MyChart".  Sign up information is provided on this After Visit Summary.  MyChart is used to connect with patients for Virtual Visits (Telemedicine).  Patients are able to view lab/test results, encounter notes, upcoming appointments, etc.  Non-urgent messages can be sent to your provider as well.   To learn more about what you can do with MyChart, go to [email or web address].    Your next appointment:   6 month(s)  The format for your next appointment:   In Person  Provider:   Jyl Heinz, MD   Other Instructions  Coronary Calcium Scan A coronary calcium scan is an imaging test used to look for deposits of plaque in the inner lining of the blood vessels  of the heart (coronary arteries). Plaque is made up of calcium, protein, and fatty substances. These deposits of plaque can partly clog and narrow the coronary arteries without producing any symptoms or warning signs. This puts a person at risk for a heart attack. This test is recommended for people who are at moderate risk for heart disease. The test can find plaque deposits before symptoms  develop. Tell a health care provider about:  Any allergies you have.  All medicines you are taking, including vitamins, herbs, eye drops, creams, and over-the-counter medicines.  Any problems you or family members have had with anesthetic medicines.  Any blood disorders you have.  Any surgeries you have had.  Any medical conditions you have.  Whether you are pregnant or may be pregnant. What are the risks? Generally, this is a safe procedure. However, problems may occur, including:  Harm to a pregnant woman and her unborn baby. This test involves the use of radiation. Radiation exposure can be dangerous to a pregnant woman and her unborn baby. If you are pregnant or think you may be pregnant, you should not have this procedure done.  Slight increase in the risk of cancer. This is because of the radiation involved in the test. What happens before the procedure? Ask your health care provider for any specific instructions on how to prepare for this procedure. You may be asked to avoid products that contain caffeine, tobacco, or nicotine for 4 hours before the procedure. What happens during the procedure?  You will undress and remove any jewelry from your neck or chest.  You will put on a hospital gown.  Sticky electrodes will be placed on your chest. The electrodes will be connected to an electrocardiogram (ECG) machine to record a tracing of the electrical activity of your heart.  You will lie down on a curved bed that is attached to the [place].  You may be given medicine to slow down your heart rate so that clear pictures can be created.  You will be moved into the CT scanner, and the CT scanner will take pictures of your heart. During this time, you will be asked to lie still and hold your breath for 2-3 seconds at a time while each picture of your heart is being taken. The procedure may vary among health care providers and hospitals.   What happens after the  procedure?  You can get dressed.  You can return to your normal activities.  It is up to you to get the results of your procedure. Ask your health care provider, or the department that is doing the procedure, when your results will be ready. Summary  A coronary calcium scan is an imaging test used to look for deposits of plaque in the inner lining of the blood vessels of the heart (coronary arteries). Plaque is made up of calcium, protein, and fatty substances.  Generally, this is a safe procedure. Tell your health care provider if you are pregnant or may be pregnant.  Ask your health care provider for any specific instructions on how to prepare for this procedure.  A CT scanner will take pictures of your heart.  You can return to your normal activities after the scan is done. This information is not intended to replace advice given to you by your health care provider. Make sure you discuss any questions you have with your health care provider. Document Revised: 10/18/2018 Document Reviewed: 10/18/2018 Elsevier Patient Education  [place].

## 2020-07-18 NOTE — Progress Notes (Signed)
Cardiology Office Note:    Date:  07/18/2020   ID:  Cynthia Wang, DOB [birth year], MRN [id number]  PCP:  Azucena Fallen, MD  Cardiologist:  Jenean Lindau, MD   Referring MD: Azucena Fallen, MD    ASSESSMENT:    1. Frequent PVCs   2. Screening cholesterol level    PLAN:    In order of problems listed above:  1. Frequent PVCs: Patient has history of PVCs.  She does not have any issues at this time.  No chest pain orthopnea or PND.  She occasionally has chest discomfort and this is not related to exertion.  No orthopnea PND or any other symptoms.  No radiation to any part of the neck.  She is concerned about this and I will go ahead and order a 2-week monitoring to assess the PVC burden.  I will also do a calcium scoring to understand her risk for coronary artery disease.  She is agreeable. 2. Cholesterol screening: She has mild dyslipidemia and will check at this time.  She is fasting we will do complete blood work. 3. Nicotine use: She was smoking in the past but has quit and now only vapes.  I told her to abstain from it.  She promises to do so. 4. Patient will be seen in follow-up appointment in 6 months or earlier if the patient has any concerns    Medication Adjustments/Labs and Tests Ordered: Current medicines are reviewed at length with the patient today.  Concerns regarding medicines are outlined above.  No orders of the defined types were placed in this encounter.  No orders of the defined types were placed in this encounter.    No chief complaint on file.    History of Present Illness:    Cynthia Wang is a 50 y.o. female.  Patient has past medical history of frequent PVCs.  She denies any problems at this time and takes care of activities of daily living.  No chest pain orthopnea or PND.  She walks on a regular basis.  With this she has no symptoms.  She occasionally complains of chest discomfort.  Past Medical History:  Diagnosis Date  . Cardiac murmur  11/04/2018  . Chest discomfort 11/04/2018  . 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 Migraine   . Ovarian cancer ([place])   . Screening cholesterol level 11/04/2018  . Sinusitis, acute 12/07/2014    Past Surgical History:  Procedure Laterality Date  . ABDOMINAL HYSTERECTOMY    . HERNIA REPAIR      Current Medications: Current Meds  Medication Sig  . ALPRAZolam (XANAX) 1 MG tablet Take 1 mg by mouth 2 (two) times daily as needed for anxiety.  . Cholecalciferol (VITAMIN D) 50 MCG (2000 UT) CAPS Take 2,000 Units by mouth daily in the afternoon.  Marland Kitchen estradiol (VIVELLE-DOT) 0.1 MG/24HR patch Place 1 patch onto the skin 2 (two) times a week.  Marland Kitchen HYDROcodone-acetaminophen (NORCO) 10-325 MG tablet Take 1 tablet by mouth every 6 (six) hours as needed for moderate pain.  Marland Kitchen levocetirizine (XYZAL) 5 MG tablet Take 5 mg by mouth at bedtime.     Allergies:   Neosporin [neomycin-bacitracin zn-polymyx]   Social History   Socioeconomic History  . Marital status: Married    Spouse name: Not on file  . Number of children: 2  . Years of education: 49  . Highest education level: Not on file  Occupational History  . Occupation: Engineer, production  Tobacco Use  . Smoking status: Former Smoker  Quit date: 09/27/2006    Years since quitting: 13.8  . Smokeless tobacco: Never Used  Substance and Sexual Activity  . Alcohol use: No  . Drug use: No  . Sexual activity: Yes    Birth control/protection: Surgical  Other Topics Concern  . Not on file  Social History Narrative   Fun: Watching softball game   Denies religious beliefs effecting health care.   Feels safe at home and denies abuse.    Social Determinants of Health   Financial Resource Strain: Not on file  Food Insecurity: Not on file  Transportation Needs: Not on file  Physical Activity: Not on file  Stress: Not on file  Social Connections: Not on file     Family History: The patient's family history includes Arthritis in her maternal grandmother  and mother; Depression in her mother; Healthy in her father; Hypertension in her maternal grandmother.  ROS:   Please see the history of present illness.    All other systems reviewed and are negative.  EKGs/Labs/Other Studies Reviewed:    The following studies were reviewed today: I discussed my findings with the patient at length.  EKG reveals sinus rhythm and frequent PVCs.   Recent Labs: No results found for requested labs within last 8760 hours.  Recent Lipid Panel    Component Value Date/Time   CHOL 197 11/04/2018 1129   TRIG 57 11/04/2018 1129   HDL 67 11/04/2018 1129   CHOLHDL 2.9 11/04/2018 1129   LDLCALC 119 (H) 11/04/2018 1129    Physical Exam:    VS:  BP 118/68   Pulse 85     Wt Readings from Last 3 Encounters:  06/09/19 148 lb (67.1 kg)  11/04/18 144 lb (65.3 kg)  06/20/18 144 lb (65.3 kg)     GEN: Patient is in no acute distress HEENT: Normal NECK: No JVD; No carotid bruits LYMPHATICS: No lymphadenopathy CARDIAC: Hear sounds regular, 2/6 systolic murmur at the apex. RESPIRATORY:  Clear to auscultation without rales, wheezing or rhonchi  ABDOMEN: Soft, non-tender, non-distended MUSCULOSKELETAL:  No edema; No deformity  SKIN: Warm and dry NEUROLOGIC:  Alert and oriented x 3 PSYCHIATRIC:  Normal affect   Signed, Jenean Lindau, MD  07/18/2020 8:31 AM    [place]

## 2020-07-18 NOTE — Addendum Note (Signed)
Addended by: Lanaya Bennis, Jonelle Sidle L on: 07/18/2020 11:26 AM   Modules accepted: Orders

## 2020-09-20 ENCOUNTER — Ambulatory Visit (INDEPENDENT_AMBULATORY_CARE_PROVIDER_SITE_OTHER)
Admission: RE | Admit: 2020-09-20 | Discharge: 2020-09-20 | Disposition: A | Discharge status: Home / Self Care | Payer: Self-pay | Source: Ambulatory Visit | Attending: Cardiology | Admitting: Cardiology

## 2020-09-20 ENCOUNTER — Other Ambulatory Visit: Payer: Self-pay

## 2020-09-20 DIAGNOSIS — I493 Ventricular premature depolarization: Secondary | ICD-10-CM

## 2020-09-20 DIAGNOSIS — Z13220 Encounter for screening for lipoid disorders: Secondary | ICD-10-CM

## 2021-01-29 DIAGNOSIS — Z8543 Personal history of malignant neoplasm of ovary: Secondary | ICD-10-CM | POA: Insufficient documentation

## 2021-02-01 LAB — HM PAP SMEAR

## 2021-10-24 DIAGNOSIS — J3489 Other specified disorders of nose and nasal sinuses: Secondary | ICD-10-CM | POA: Diagnosis not present

## 2021-10-24 DIAGNOSIS — R519 Headache, unspecified: Secondary | ICD-10-CM | POA: Diagnosis not present

## 2021-10-24 DIAGNOSIS — J309 Allergic rhinitis, unspecified: Secondary | ICD-10-CM | POA: Diagnosis not present

## 2021-10-24 DIAGNOSIS — H6982 Other specified disorders of Eustachian tube, left ear: Secondary | ICD-10-CM | POA: Diagnosis not present

## 2021-11-05 ENCOUNTER — Telehealth: Payer: Self-pay

## 2021-11-05 NOTE — Telephone Encounter (Signed)
Pt came by the office req an appointment as she is having swelling in her legs and sharp chest pain. Left VM on home and cell phone.

## 2021-11-06 NOTE — Telephone Encounter (Signed)
Patient was returning a call. Please advise  

## 2021-11-10 NOTE — Telephone Encounter (Signed)
Patient was returning call. Please advise ?

## 2021-11-10 NOTE — Telephone Encounter (Signed)
Appointment made for 11/11/21 with Dr. Geraldo Pitter.

## 2021-11-10 NOTE — Telephone Encounter (Signed)
Left VM for pt to call back.

## 2021-11-11 ENCOUNTER — Encounter: Payer: Self-pay | Admitting: Cardiology

## 2021-11-11 ENCOUNTER — Ambulatory Visit (INDEPENDENT_AMBULATORY_CARE_PROVIDER_SITE_OTHER): Payer: 59 | Admitting: Cardiology

## 2021-11-11 VITALS — BP 102/68 | HR 81 | Ht 67.0 in | Wt 150.2 lb

## 2021-11-11 DIAGNOSIS — I493 Ventricular premature depolarization: Secondary | ICD-10-CM | POA: Diagnosis not present

## 2021-11-11 DIAGNOSIS — L821 Other seborrheic keratosis: Secondary | ICD-10-CM | POA: Insufficient documentation

## 2021-11-11 HISTORY — DX: Other seborrheic keratosis: L82.1

## 2021-11-11 NOTE — Patient Instructions (Signed)

## 2021-11-11 NOTE — Progress Notes (Signed)
Cardiology Office Note:    Date:  11/11/2021   ID:  Cynthia Wang, DOB 01-Aug-1970, MRN [id number]  PCP:  Azucena Fallen, MD  Cardiologist:  Jenean Lindau, MD   Referring MD: Azucena Fallen, MD    ASSESSMENT:    1. Frequent PVCs    PLAN:    In order of problems listed above:  Primary prevention stressed with patient.  Importance of compliance with diet and medication stressed and she vocalized understanding.  She was advised to walk at least 30 to 40 minutes a day 5 times a week and she promises to do so. Chest discomfort: This has resolved and she is happy about this. Frequent PVCs: Medical management at this time.  They are asymptomatic and we will continue to monitor.  Last echocardiogram revealed preserved ejection fraction. Patient will be seen in follow-up appointment in 6 months or earlier if the patient has any concerns    Medication Adjustments/Labs and Tests Ordered: Current medicines are reviewed at length with the patient today.  Concerns regarding medicines are outlined above.  No orders of the defined types were placed in this encounter.  No orders of the defined types were placed in this encounter.    No chief complaint on file.    History of Present Illness:    Cynthia Wang is a 51 y.o. female.  Patient has been seen by me regularly for frequent PVCs.  Currently she denies any chest pain orthopnea or PND.  She tells me that her chest pain has resolved.  Her calcium score was 0.  Her PVCs are asymptomatic at this time.  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   fluticasone (FLONASE) 50 MCG/ACT nasal spray Place 2 sprays into both nostrils daily.   levocetirizine (XYZAL) 5 MG tablet Take 5 mg by mouth at bedtime.   Vitamin D, Ergocalciferol, (DRISDOL) 1.25 MG (50000 UNIT) CAPS capsule Take 50,000 Units by mouth once a week.     Allergies:   Neosporin [neomycin-bacitracin zn-polymyx]   Social History   Socioeconomic History   Marital status: Married    Spouse name: Not on file   Number of children: 2   Years of education: 16   Highest education level: Not on file  Occupational History   Occupation: [employer]  Tobacco Use   Smoking status: Former    Types: Cigarettes    Quit date: 09/27/2006    Years since quitting: 15.1   Smokeless tobacco: Never  Substance and Sexual Activity   Alcohol use: No   Drug use: No   Sexual activity: Yes    Birth control/protection: Surgical  Other Topics Concern   Not on file  Social History Narrative   Fun: Watching softball game   Denies religious beliefs effecting health care.   Feels safe at home and denies abuse.    Social Determinants of Health   Financial Resource Strain: Not on file  Food Insecurity: Not on file  Transportation Needs: Not on file  Physical Activity: Not on file  Stress: Not on file  Social Connections: Not on file     Family History: The patient's family history includes Arthritis in her maternal grandmother and mother; Depression in her mother; Healthy in her father; Hypertension in her maternal grandmother.  ROS:   Please see the history of present illness.    All other systems reviewed and are negative.  EKGs/Labs/Other Studies Reviewed:    The following studies were reviewed today: EKG reveals sinus rhythm LVH and frequent PVCs and nonspecific ST-T changes   Recent Labs: No results found for requested labs within last 365 days.  Recent Lipid Panel    Component Value Date/Time   CHOL 197 11/04/2018 1129    TRIG 57 11/04/2018 1129   HDL 67 11/04/2018 1129   CHOLHDL 2.9 11/04/2018 1129   LDLCALC 119 (H) 11/04/2018 1129    Physical Exam:    VS:  BP 102/68   Pulse 81   Ht '5\' 7"'$  (1.702 m)   Wt 150 lb 3.2 oz (68.1 kg)   SpO2 97%   BMI 23.52 kg/m     Wt Readings from Last 3 Encounters:  11/11/21 150 lb 3.2 oz (68.1 kg)  07/18/20 145 lb 4.8 oz (65.9 kg)  06/09/19 148 lb (67.1 kg)     GEN: Patient is in no acute distress HEENT: Normal NECK: No JVD; No carotid bruits LYMPHATICS: No lymphadenopathy CARDIAC: Hear sounds regular, 2/6 systolic murmur at the apex. RESPIRATORY:  Clear to auscultation without rales, wheezing or rhonchi  ABDOMEN: Soft, non-tender, non-distended MUSCULOSKELETAL:  No edema; No deformity  SKIN: Warm and dry NEUROLOGIC:  Alert and oriented x 3 PSYCHIATRIC:  Normal affect   Signed, Jenean Lindau, MD  11/11/2021 2:01 PM    [place]

## 2021-11-27 ENCOUNTER — Ambulatory Visit: Payer: Self-pay | Admitting: Cardiology

## 2022-01-30 DIAGNOSIS — Z780 Asymptomatic menopausal state: Secondary | ICD-10-CM | POA: Diagnosis not present

## 2022-01-30 DIAGNOSIS — Z6824 Body mass index (BMI) 24.0-24.9, adult: Secondary | ICD-10-CM | POA: Diagnosis not present

## 2022-01-30 DIAGNOSIS — Z79890 Hormone replacement therapy (postmenopausal): Secondary | ICD-10-CM | POA: Diagnosis not present

## 2022-01-30 DIAGNOSIS — Z1239 Encounter for other screening for malignant neoplasm of breast: Secondary | ICD-10-CM | POA: Diagnosis not present

## 2022-01-30 DIAGNOSIS — Z01419 Encounter for gynecological examination (general) (routine) without abnormal findings: Secondary | ICD-10-CM | POA: Diagnosis not present

## 2022-01-30 DIAGNOSIS — Z1231 Encounter for screening mammogram for malignant neoplasm of breast: Secondary | ICD-10-CM | POA: Diagnosis not present

## 2022-02-13 ENCOUNTER — Ambulatory Visit: Payer: Self-pay | Admitting: Cardiology

## 2022-05-05 IMAGING — US US BREAST*R* LIMITED INC AXILLA
1 series · 8 of 8 positions shown · non-contrast
Comparison: Previous exam(s).

CLINICAL DATA: 49-year-old female recalled from outside screening
mammogram dated 02/15/2020 for a possible right breast mass.

EXAM:
DIGITAL DIAGNOSTIC RIGHT MAMMOGRAM WITH CAD AND TOMO
ULTRASOUND RIGHT BREAST

[Series 1: us breast*right* limited inc axilla · 0.06mm/px · 8 of 8 slices shown]
[im 1/8]
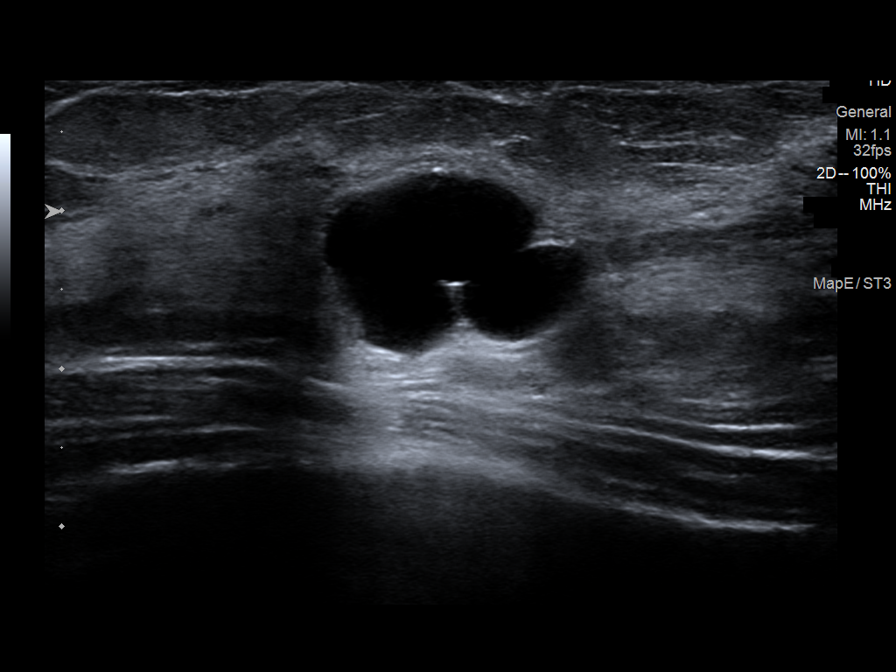
[im 2/8]
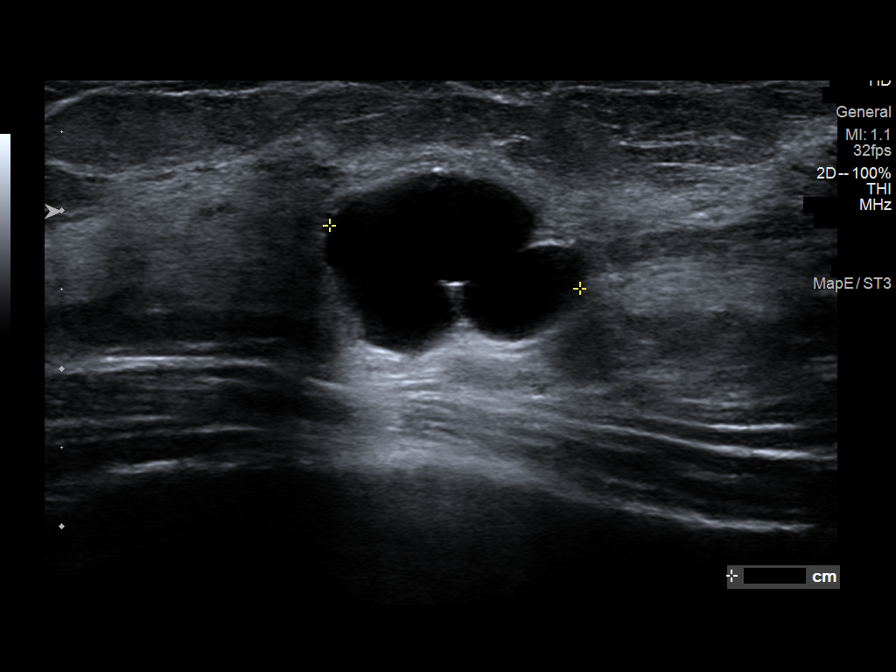
[im 3/8]
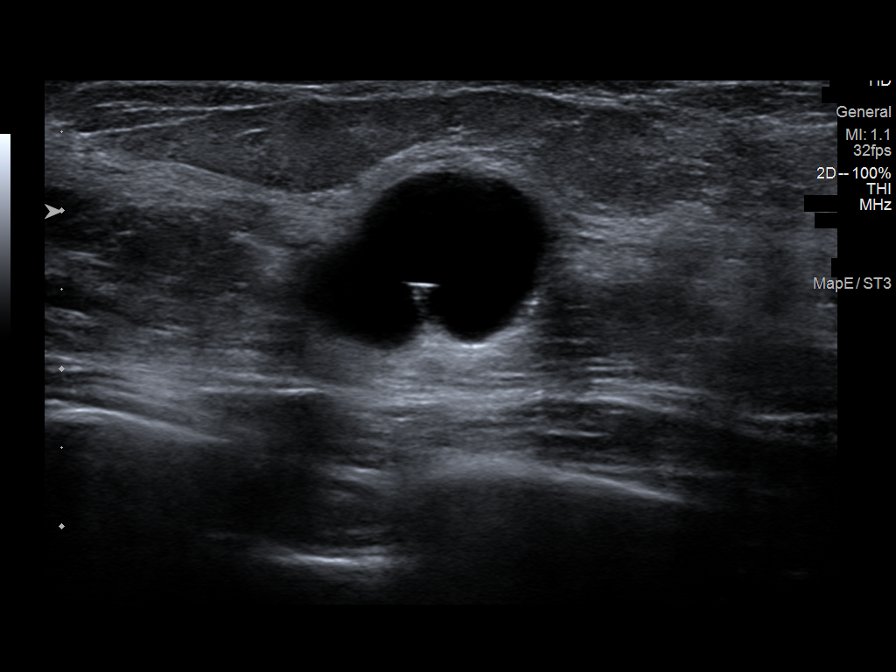
[im 4/8]
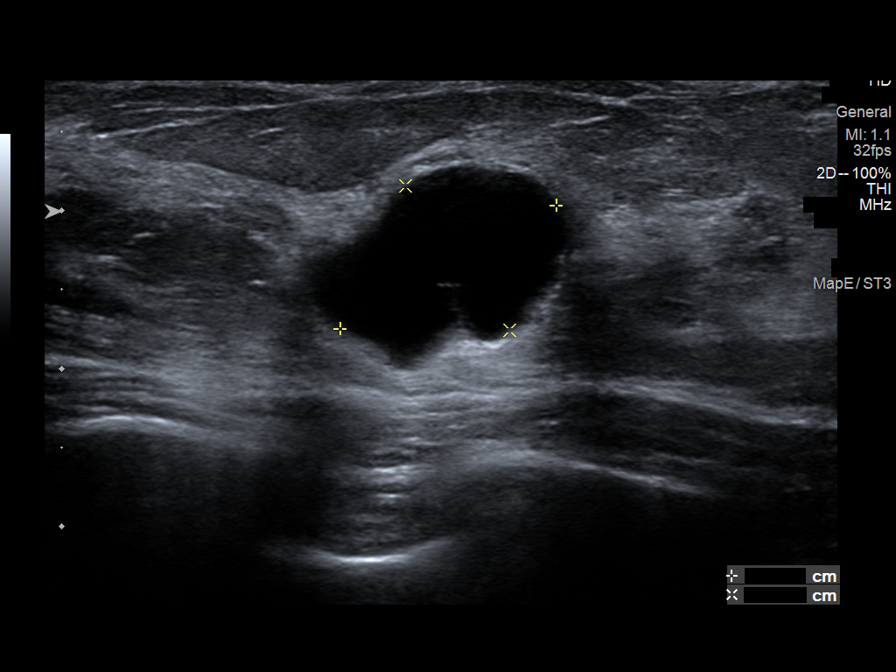
[im 5/8]
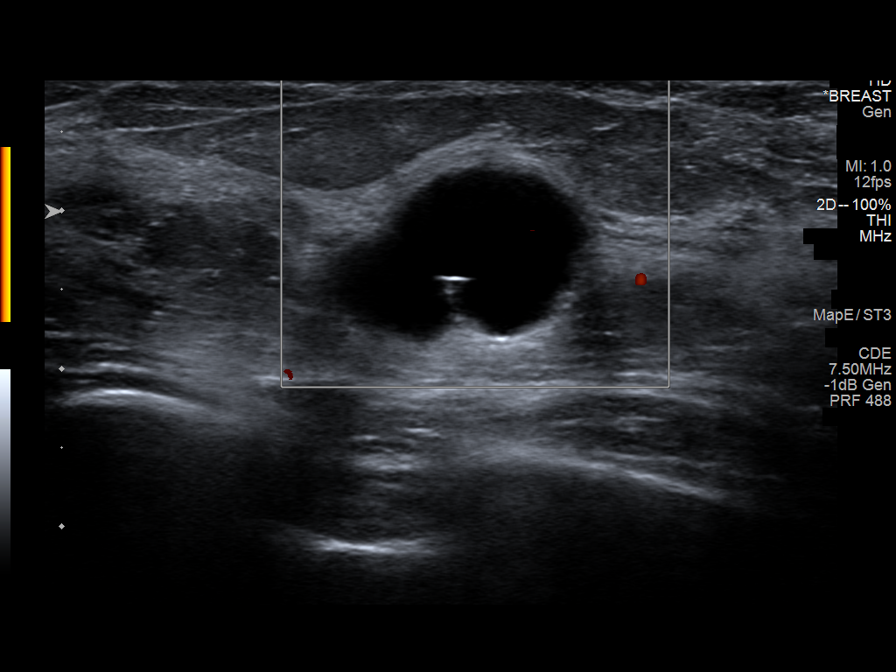
[im 6/8]
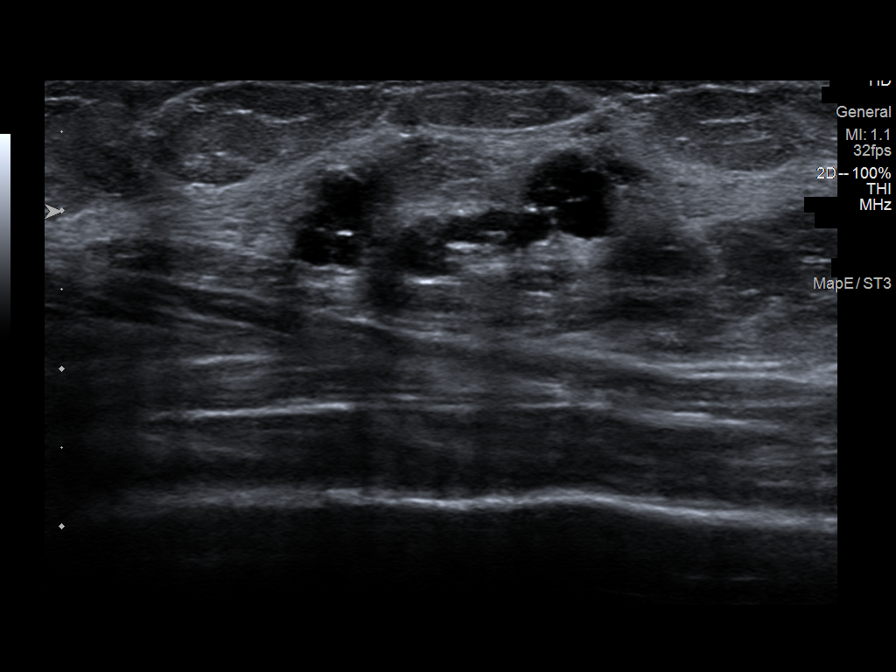
[im 7/8]
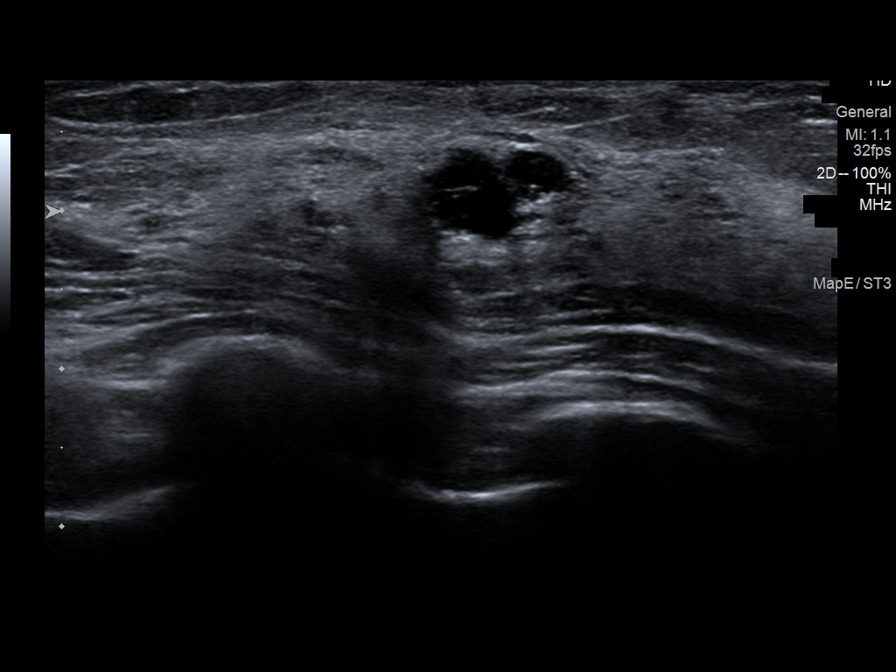
[im 8/8]
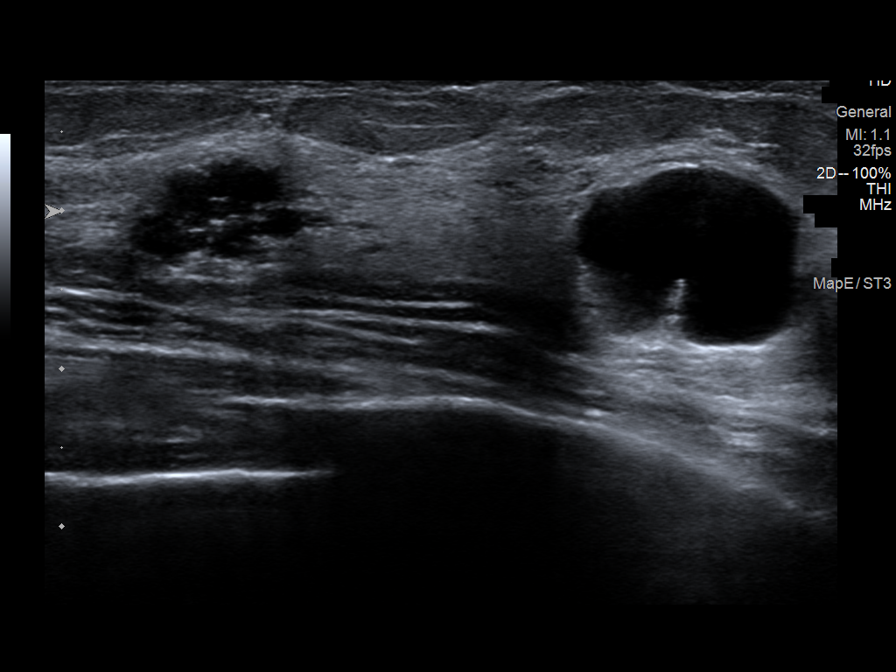

[8 of 8 positions shown; findings below may reference images not displayed]

ACR Breast Density Category c: The breast tissue is heterogeneously
dense, which may obscure small masses.
FINDINGS: There is a persistent oval, circumscribed lobulated mass in the
upper outer quadrant of the right breast at middle depth. Further
evaluation with ultrasound was performed.

Mammographic images were processed with CAD.

Targeted ultrasound is performed, showing an oval, circumscribed
anechoic mass with posterior acoustic enhancement at the 10 o'clock
position 3 cm from the nipple. It measures 1.6 x 1.6 x 1.1 cm. This
correlates well with the mammographic finding and is consistent with
a benign simple cyst. An adjacent cluster of cysts is incidentally
noted adjacent to the mass.
IMPRESSION: Benign fibrocystic changes corresponding with the screening
mammographic findings. No further imaging follow-up required.

RECOMMENDATION:
Screening mammogram in one year.(Code:SO-E-AXR)

I have discussed the findings and recommendations with the patient.
If applicable, a reminder letter will be sent to the patient
regarding the next appointment.

BI-RADS CATEGORY  2: Benign.

## 2022-11-19 IMAGING — CT CT CARDIAC CORONARY ARTERY CALCIUM SCORE
3 series · 14 of 20 positions shown, 15 images · non-contrast
Comparison: Chest radiograph 06/15/2018
COMPARISON: Chest radiograph 06/15/2018

Addendum:
EXAM:
OVER-READ INTERPRETATION  CT CHEST

The following report is an over-read performed by radiologist Dr.
Naemie Ziska [REDACTED] on 09/20/2020. This over-read
does not include interpretation of cardiac or coronary anatomy or
pathology. The calcium score interpretation by the cardiologist is
attached.
CLINICAL DATA: Cardiovascular disease risk stratification
CT Coronary Calcium Score
TECHNIQUE: A gated, non-contrast computed tomography scan of the heart was
performed using 3mm slice thickness. Axial images were analyzed on a
dedicated workstation. Calcium scoring of the coronary arteries was
performed using the Agatston method.

[Series 2: casc 3.0 bv41 2 bestsyst 266 ms · axial · 0.39mm/px · z∈[+7,+88]mm · 4 of 46 slices shown, 5 images]
[im 10/46  vessel]
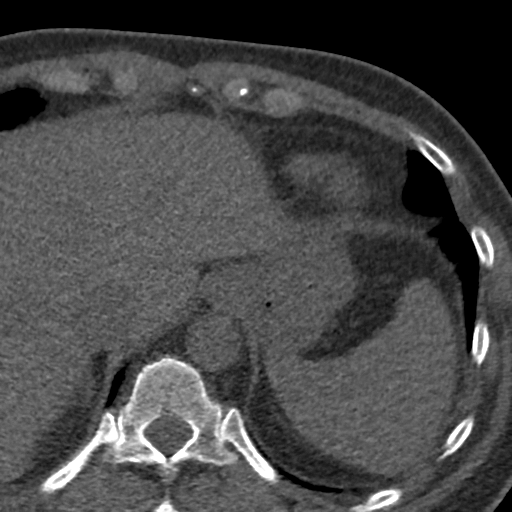
[im 10/46  lung]
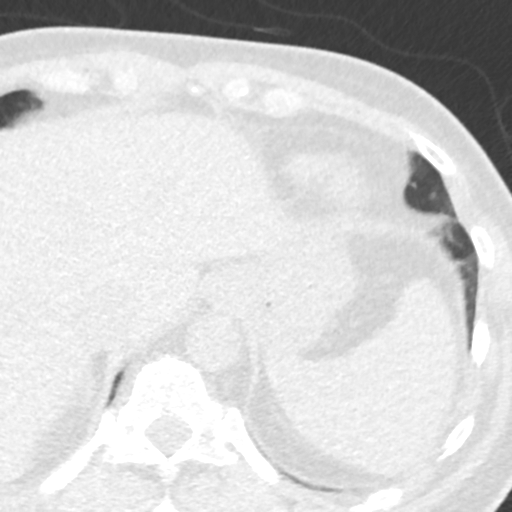
[im 19/46  vessel]
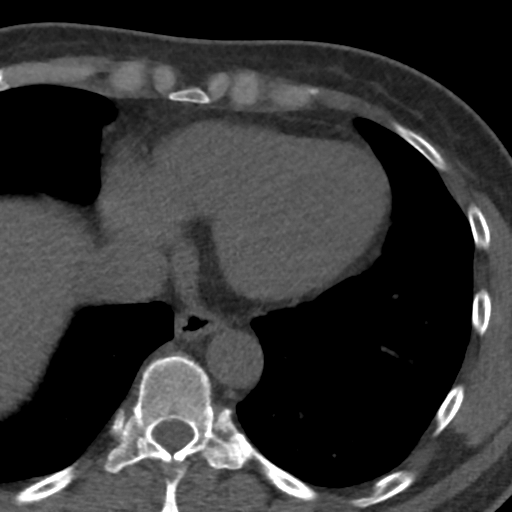
[im 28/46  vessel]
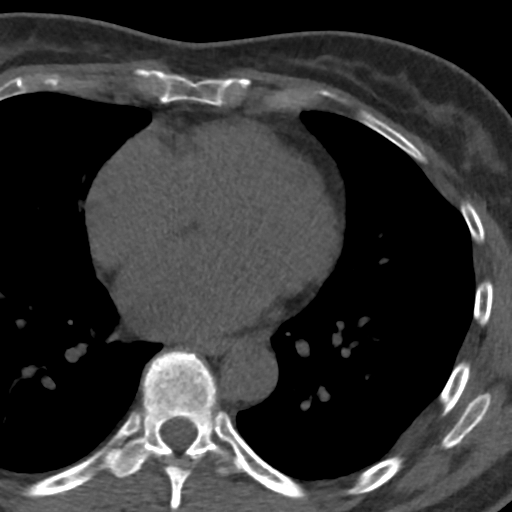
[im 37/46  vessel]
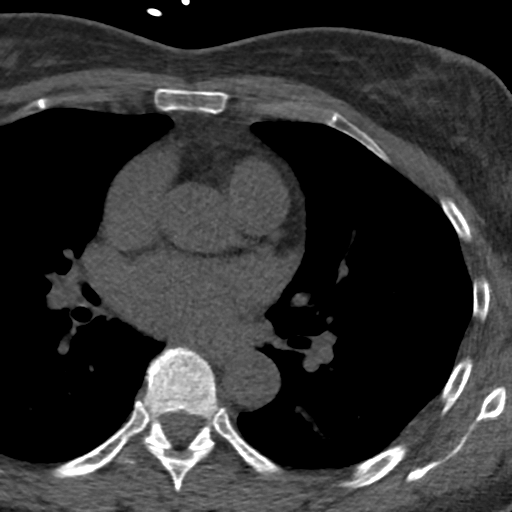

[Series 3: lung 267 ms · axial · 0.68mm/px · z∈[+1,+91]mm · 5 of 46 slices shown]
[im 8/46  lung]
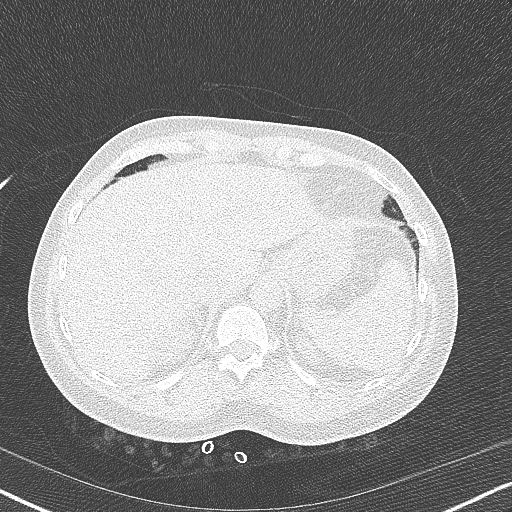
[im 16/46  lung]
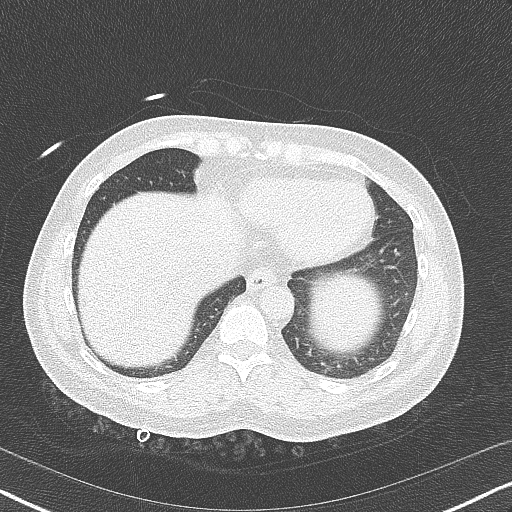
[im 23/46  lung]
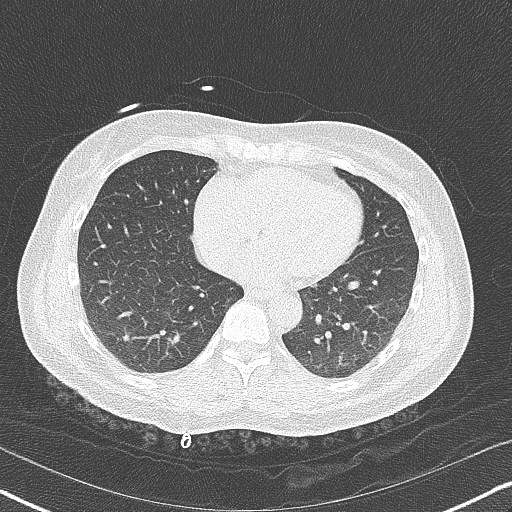
[im 31/46  lung]
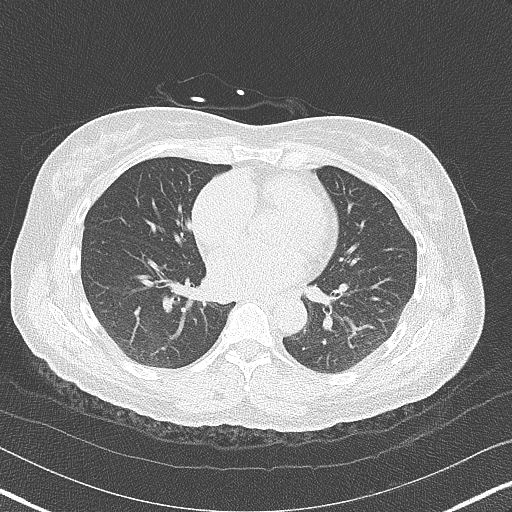
[im 38/46  lung]
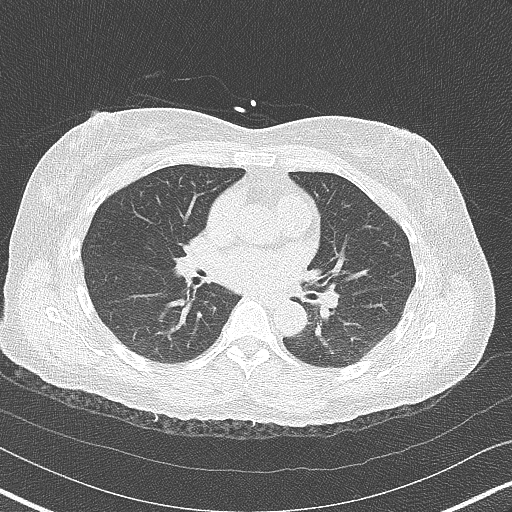

[Series 4: lung st 267 ms · axial · 0.68mm/px · z∈[+1,+91]mm · 5 of 46 slices shown]
[im 8/46  lung]
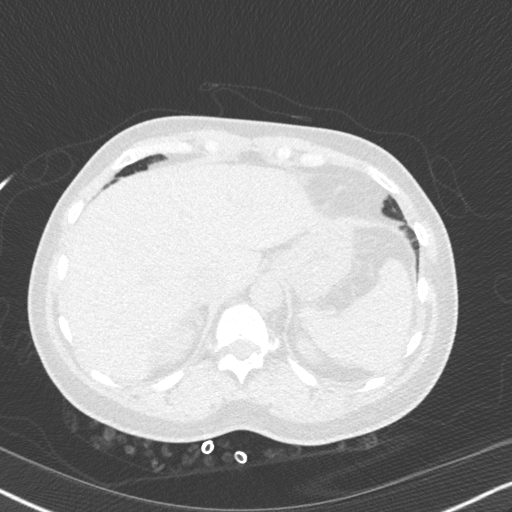
[im 16/46  lung]
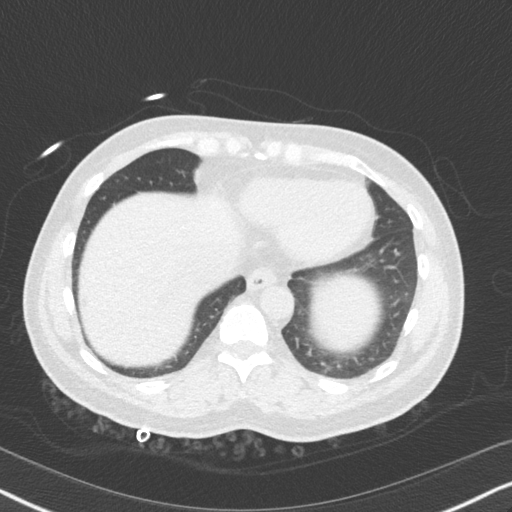
[im 23/46  lung]
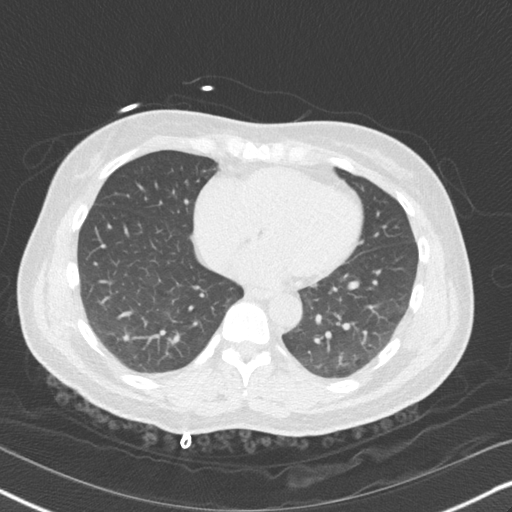
[im 31/46  lung]
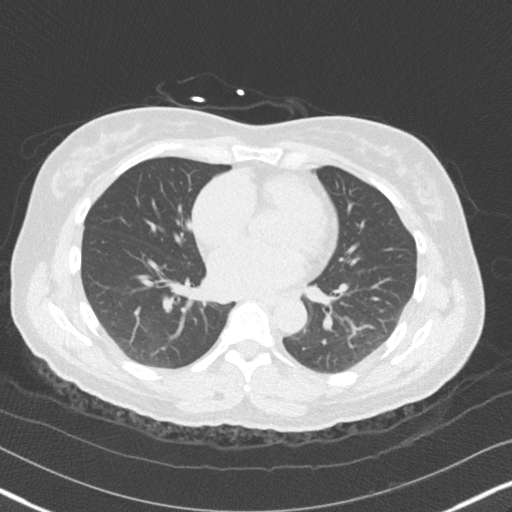
[im 38/46  lung]
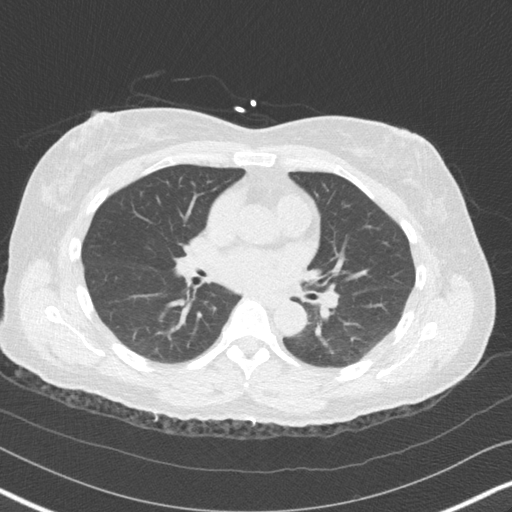

[14 of 20 positions shown; findings below may reference images not displayed]

FINDINGS: Vascular: Normal aortic caliber.

Mediastinum/Nodes: No imaged thoracic adenopathy.

Lungs/Pleura: No pleural fluid.  Clear imaged lungs.

Upper Abdomen: Low-density segment 4A subcentimeter liver lesion is
likely a cyst. Normal imaged portions of the spleen, stomach,
pancreas, adrenal glands, kidneys.

Musculoskeletal: Subcutaneous nodule superficial to the sternum
measures 8 mm on [DATE]. No acute osseous abnormality.
IMPRESSION: 1. No acute process in the imaged extracardiac chest.
2. 8 mm subcutaneous nodule superficial to the sternum. Most likely
a sebaceous cyst. Consider physical exam correlation.
FINDINGS: Coronary arteries: Normal origins.

Coronary Calcium Score:

Left main: 0

Left anterior descending artery: 0

Left circumflex artery: 0

Right coronary artery: 0

Total: 0

Pericardium: Normal.

Ascending Aorta: Normal caliber. Ascending aorta measures
approximately 27mm at the mid ascending aorta measured in an axial
plane.

Non-cardiac: See separate report from [REDACTED].
IMPRESSION: Coronary calcium score of 0.



If CAC=0, it is reasonable to withhold statin therapy and reassess
in 5 to 10 years, as long as higher risk conditions are absent
(diabetes mellitus, family history of premature CHD in first degree
relatives (males <55 years; females <65 years), cigarette smoking,
or LDL >=190 mg/dL).

If CAC is 1 to 99, it is reasonable to initiate statin therapy for
patients >=55 years of age.

If CAC is >=100 or >=75th percentile, it is reasonable to initiate
statin therapy at any age.

Cardiology referral should be considered for patients with CAC
scores >=400 or >=75th percentile.

*0664 AHA/ACC/AACVPR/AAPA/ABC/NTEYAN/MAHOKA/TOMO/[place]/JUMPER/NORFA/PAULS
Guideline on the Management of Blood Cholesterol: A Report of the
American College of Cardiology/American Heart Association Task Force
on Clinical Practice Guidelines. J Am Coll Cardiol.
0414;73(24):6142-6812.

*** End of Addendum ***
EXAM:
OVER-READ INTERPRETATION  CT CHEST

The following report is an over-read performed by radiologist Dr.
Naemie Ziska [REDACTED] on 09/20/2020. This over-read
does not include interpretation of cardiac or coronary anatomy or
pathology. The calcium score interpretation by the cardiologist is
attached.
FINDINGS: Vascular: Normal aortic caliber.

Mediastinum/Nodes: No imaged thoracic adenopathy.

Lungs/Pleura: No pleural fluid.  Clear imaged lungs.

Upper Abdomen: Low-density segment 4A subcentimeter liver lesion is
likely a cyst. Normal imaged portions of the spleen, stomach,
pancreas, adrenal glands, kidneys.

Musculoskeletal: Subcutaneous nodule superficial to the sternum
measures 8 mm on [DATE]. No acute osseous abnormality.
IMPRESSION: 1. No acute process in the imaged extracardiac chest.
2. 8 mm subcutaneous nodule superficial to the sternum. Most likely
a sebaceous cyst. Consider physical exam correlation.

## 2023-02-11 ENCOUNTER — Ambulatory Visit: Payer: 59 | Admitting: Cardiology

## 2023-02-25 ENCOUNTER — Ambulatory Visit: Payer: Self-pay | Attending: Cardiology | Admitting: Cardiology

## 2023-02-25 ENCOUNTER — Encounter: Payer: Self-pay | Admitting: Cardiology

## 2023-02-25 VITALS — BP 102/64 | HR 68 | Ht 67.0 in | Wt 149.2 lb

## 2023-02-25 DIAGNOSIS — I493 Ventricular premature depolarization: Secondary | ICD-10-CM

## 2023-02-25 NOTE — Patient Instructions (Signed)
Medication Instructions:  Your physician recommends that you continue on your current medications as directed. Please refer to the Current Medication list given to you today.  *If you need a refill on your cardiac medications before your next appointment, please call your pharmacy*   Lab Work: None If you have labs (blood work) drawn today and your tests are completely normal, you will receive your results only by: MyChart Message (if you have MyChart) OR A paper copy in the mail If you have any lab test that is abnormal or we need to change your treatment, we will call you to review the results.   Testing/Procedures: None   Follow-Up: At [facility], you and your health needs are our priority.  As part of our continuing mission to provide you with exceptional heart care, we have created designated Provider Care Teams.  These Care Teams include your primary Cardiologist (physician) and Advanced Practice Providers (APPs -  Physician Assistants and Nurse Practitioners) who all work together to provide you with the care you need, when you need it.  We recommend signing up for the patient portal called "MyChart".  Sign up information is provided on this After Visit Summary.  MyChart is used to connect with patients for Virtual Visits (Telemedicine).  Patients are able to view lab/test results, encounter notes, upcoming appointments, etc.  Non-urgent messages can be sent to your provider as well.   To learn more about what you can do with MyChart, go to [email or web address].    Your next appointment:   1 year(s)  Provider:   Belva Crome, MD    Other Instructions

## 2023-02-25 NOTE — Progress Notes (Signed)
Cardiology Office Note:    Date:  02/25/2023   ID:  Cynthia Wang, DOB 01/24/1971, MRN [id number]  PCP:  Jaymes Graff, MD  Cardiologist:  Garwin Brothers, MD   Referring MD: Shea Evans, MD    ASSESSMENT:    1. Frequent PVCs    PLAN:    In order of problems listed above:  Primary prevention stressed with the patient.  Importance of compliance with diet medication stressed and patient verbalized standing. Frequent PVCs: These have resolved.  Patient is doing well.  She has no palpitations or any such issues. She was advised to exercise on a regular basis.  I told her the risks of bleeding a sedentary lifestyle and she promises to do better.  Lipids and other blood work done by primary care and followed by them. She was seen in follow-up appointment annual basis.   Medication Adjustments/Labs and Tests Ordered: Current medicines are reviewed at length with the patient today.  Concerns regarding medicines are outlined above.  Orders Placed This Encounter  Procedures   EKG 12-Lead   No orders of the defined types were placed in this encounter.    No chief complaint on file.    History of Present Illness:    Cynthia Wang is a 52 y.o. female.  Patient has past medical history of frequent PVCs.  She denies any problems at this time and takes care of activities of daily living.  No chest pain orthopnea or PND.  She is a sedentary lifestyle.  She denies any chest pain orthopnea or PND.  At the time of my evaluation, the patient is alert awake oriented and in no distress.  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     Allergies:   Neosporin [neomycin-bacitracin zn-polymyx]   Social History   Socioeconomic History   Marital status: Married    Spouse name: Not on file   Number of children: 2   Years of education: 16   Highest education level: Not on file  Occupational History   Occupation: Behavorial Health  Tobacco Use   Smoking status: Former    Current packs/day: 0.00    Types: Cigarettes    Quit date: 09/27/2006    Years since quitting: 16.4   Smokeless tobacco: Never  Substance and Sexual Activity   Alcohol use: No   Drug use: No   Sexual activity: Yes    Birth control/protection: Surgical  Other Topics Concern   Not on file  Social History Narrative   Fun: Watching softball game   Denies religious beliefs effecting health care.   Feels safe at home and denies abuse.    Social Determinants of Health   Financial Resource Strain: Not on file  Food Insecurity: Not on file  Transportation Needs: Not on file  Physical Activity: Not on file  Stress: Not on file  Social  Connections: Not on file     Family History: The patient's family history includes Arthritis in her maternal grandmother and mother; Depression in her mother; Healthy in her father; Hypertension in her maternal grandmother.  ROS:   Please see the history of present illness.    All other systems reviewed and are negative.  EKGs/Labs/Other Studies Reviewed:    The following studies were reviewed today: .Marland KitchenEKG Interpretation Date/Time:  Thursday February 25 2023 14:14:28 EST Ventricular Rate:  68 PR Interval:  118 QRS Duration:  98 QT Interval:  410 QTC Calculation: 435 R Axis:   77  Text Interpretation: Normal sinus rhythm Possible Left atrial enlargement Left ventricular  hypertrophy Lateral infarct , age undetermined Possible Inferior infarct , age undetermined Abnormal ECG When compared with ECG of 15-Jun-2018 22:11, PREVIOUS ECG IS PRESENT Confirmed by Belva Crome [phone]) on 02/25/2023 1:22:32 PM     Recent Labs: No results found for requested labs within last 365 days.  Recent Lipid Panel    Component Value Date/Time   CHOL 197 11/04/2018 1129   TRIG 57 11/04/2018 1129   HDL 67 11/04/2018 1129   CHOLHDL 2.9 11/04/2018 1129   LDLCALC 119 (H) 11/04/2018 1129    Physical Exam:    VS:  BP 102/64   Pulse 68   Ht 5\' 7"  (1.702 m)   Wt 149 lb 3.2 oz (67.7 kg)   BMI 23.37 kg/m     Wt Readings from Last 3 Encounters:  02/25/23 149 lb 3.2 oz (67.7 kg)  11/11/21 150 lb 3.2 oz (68.1 kg)  07/18/20 145 lb 4.8 oz (65.9 kg)     GEN: Patient is in no acute distress HEENT: Normal NECK: No JVD; No carotid bruits LYMPHATICS: No lymphadenopathy CARDIAC: Hear sounds regular, 2/6 systolic murmur at the apex. RESPIRATORY:  Clear to auscultation without rales, wheezing or rhonchi  ABDOMEN: Soft, non-tender, non-distended MUSCULOSKELETAL:  No edema; No deformity  SKIN: Warm and dry NEUROLOGIC:  Alert and oriented x 3 PSYCHIATRIC:  Normal affect   Signed, Garwin Brothers, MD  02/25/2023 1:35 PM    [facility]

## 2023-03-03 DIAGNOSIS — Z1231 Encounter for screening mammogram for malignant neoplasm of breast: Secondary | ICD-10-CM | POA: Diagnosis not present

## 2023-03-03 LAB — HM MAMMOGRAPHY

## 2023-03-26 ENCOUNTER — Telehealth: Payer: Self-pay | Admitting: Cardiology

## 2023-03-26 DIAGNOSIS — Z01419 Encounter for gynecological examination (general) (routine) without abnormal findings: Secondary | ICD-10-CM | POA: Diagnosis not present

## 2023-03-26 DIAGNOSIS — Z79890 Hormone replacement therapy (postmenopausal): Secondary | ICD-10-CM | POA: Diagnosis not present

## 2023-03-26 DIAGNOSIS — R079 Chest pain, unspecified: Secondary | ICD-10-CM | POA: Diagnosis not present

## 2023-03-26 NOTE — Telephone Encounter (Signed)
Patient is requesting to switch from Dr. Tomie China to Dr. Antoine Poche because she moved to [facility]. Please advise.

## 2023-03-31 ENCOUNTER — Encounter (HOSPITAL_BASED_OUTPATIENT_CLINIC_OR_DEPARTMENT_OTHER): Payer: Self-pay | Admitting: Family Medicine

## 2023-03-31 ENCOUNTER — Ambulatory Visit (HOSPITAL_BASED_OUTPATIENT_CLINIC_OR_DEPARTMENT_OTHER): Payer: 59 | Admitting: Family Medicine

## 2023-03-31 ENCOUNTER — Encounter (HOSPITAL_BASED_OUTPATIENT_CLINIC_OR_DEPARTMENT_OTHER): Payer: Self-pay | Admitting: *Deleted

## 2023-03-31 VITALS — BP 118/78 | HR 69 | Ht 67.0 in | Wt 151.8 lb

## 2023-03-31 DIAGNOSIS — I493 Ventricular premature depolarization: Secondary | ICD-10-CM

## 2023-03-31 DIAGNOSIS — R519 Headache, unspecified: Secondary | ICD-10-CM

## 2023-03-31 DIAGNOSIS — J309 Allergic rhinitis, unspecified: Secondary | ICD-10-CM

## 2023-03-31 DIAGNOSIS — Z0000 Encounter for general adult medical examination without abnormal findings: Secondary | ICD-10-CM

## 2023-03-31 MED ORDER — FLUTICASONE PROPIONATE 50 MCG/ACT NA SUSP: 1.0000 | Freq: Every day | NASAL | 1 refills | Status: DC

## 2023-03-31 MED ORDER — FEXOFENADINE HCL 180 MG PO TABS: 180.0000 mg | ORAL_TABLET | Freq: Every day | ORAL | 1 refills | Status: AC

## 2023-03-31 NOTE — Patient Instructions (Signed)
  Medication Instructions:  Your physician recommends that you continue on your current medications as directed. Please refer to the Current Medication list given to you today. --If you need a refill on any your medications before your next appointment, please call your pharmacy first. If no refills are authorized on file call the office.-- Lab Work: Your physician has recommended that you have lab work today: 1 week before If you have labs (blood work) drawn today and your tests are completely normal, you will receive your results via MyChart message OR a phone call from our staff.  Please ensure you check your voicemail in the event that you authorized detailed messages to be left on a delegated number. If you have any lab test that is abnormal or we need to change your treatment, we will call you to review the results.   Follow-Up: Your next appointment:   Your physician recommends that you schedule a follow-up appointment in: 2 months physical with Dr. de Peru  You will receive a text message or e-mail with a link to a survey about your care and experience with [place] today! We would greatly appreciate your feedback!   Thanks for letting us be apart of your health journey!!  Primary Care and Sports Medicine   Dr. Ceasar Mons Peru   We encourage you to activate your patient portal called "MyChart".  Sign up information is provided on this After Visit Summary.  MyChart is used to connect with patients for Virtual Visits (Telemedicine).  Patients are able to view lab/test results, encounter notes, upcoming appointments, etc.  Non-urgent messages can be sent to your provider as well. To learn more about what you can do with MyChart, please visit --  [email or web address].

## 2023-03-31 NOTE — Progress Notes (Signed)
New Patient Office Visit  Subjective   Patient ID: Cynthia Wang, female    DOB: 1970-10-28  Age: 52 y.o. MRN: [id number]  CC:  Chief Complaint  Patient presents with   New Patient (Initial Visit)    New Patient pt has been having headaches for 2 - 3 days at a time     HPI Cynthia Wang presents to establish care Last PCP - Dr. Normand Sloop (OBGYN)  Headaches: Reports history of headaches in the past, however has had recent issues more consistent headaches last week.  She did feel it may have been more so related to sleeping position/pillow.  She has changed this and does report that this week, so far her headaches have been better.  Not Nestl aware of any other exacerbating factors or triggers for recent headaches.  Has had some associated neck pain  Sneezing: Does report that she has had increased rhinorrhea/sneezing, this has been going on a number of weeks.  Over, chills, sweats, no sinus congestion she has been utilizing levocetirizine, however does not feel that it has been effective  Patient also has history of frequent PVCs for which she has seen cardiology in the past.  She does have some questions today regarding recent EKGs  Patient is originally from [place], has been living here since 2004. Patient is licensed clinical addiction specialist. Outside of work, she enjoys working; will be opening transitional house in [place] with her husband.  Outpatient Encounter Medications as of 03/31/2023  Medication Sig   estradiol (VIVELLE-DOT) 0.1 MG/24HR patch Place 1 patch onto the skin 2 (two) times a week.   fexofenadine (ALLEGRA) 180 MG tablet Take 1 tablet (180 mg total) by mouth daily.   fluticasone (FLONASE) 50 MCG/ACT nasal spray Place 1 spray into both nostrils daily.   [DISCONTINUED] levocetirizine (XYZAL) 5 MG tablet every evening.   No facility-administered encounter medications on file as of 03/31/2023.    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   Migraine    Ovarian cancer (HCC)    Screening cholesterol level 11/04/2018   Sinusitis, acute 12/07/2014    Past Surgical History:  Procedure Laterality Date   ABDOMINAL HYSTERECTOMY     HERNIA REPAIR      Family History  Problem Relation Age of Onset   Arthritis Mother    Depression Mother    Healthy Father    Arthritis Maternal Grandmother    Hypertension Maternal Grandmother     Social History   Socioeconomic History   Marital status: Married    Spouse name: Not on file   Number of children: 2   Years of education: 16   Highest education level: Not on file  Occupational History   Occupation: Behavorial Health  Tobacco Use   Smoking status: Former    Current packs/day: 0.00    Types: Cigarettes    Quit date: 09/27/2006    Years since quitting: 16.5    Passive exposure: Past   Smokeless tobacco: Never  Vaping Use   Vaping status: Every Day  Substance and Sexual Activity   Alcohol use: No   Drug use: No   Sexual activity: Yes    Birth control/protection: Surgical  Other Topics Concern   Not on file  Social History Narrative  Fun: Copywriter, advertising game   Denies religious beliefs effecting health care.   Feels safe at home and denies abuse.    Social Drivers of Corporate investment banker Strain: Not on file  Food Insecurity: Not on file  Transportation Needs: Not on file  Physical Activity: Not on file  Stress: Not on file  Social Connections: Not on file  Intimate Partner Violence: Not on file    Objective   BP 118/78 (BP Location: Left Arm, Patient Position: Sitting, Cuff Size: Normal)   Pulse 69   Ht 5\' 7"  (1.702 m)   Wt 151 lb 12.8 oz (68.9 kg)   SpO2 98%   BMI 23.78 kg/m   Physical Exam  52 year old female in no acute  distress Cardiovascular exam with regular rate and rhythm Lungs clear to auscultation bilaterally  Assessment & Plan:   Allergic rhinitis, unspecified seasonality, unspecified trigger Assessment & Plan: Previously utilizing the cetirizine without significant relief of symptoms.  We did discuss alternatives including fexofenadine, loratadine.  She will look to utilize fexofenadine.  Can also utilize intranasal sprays including intranasal steroid spray such as Flonase.  We will monitor progress with this, if continue to have issues despite above, consider referral to ENT or allergist   Wellness examination -     CBC with Differential/Platelet; Future -     Comprehensive metabolic panel; Future -     Hemoglobin A1c; Future -     Lipid panel; Future -     TSH Rfx on Abnormal to Free T4; Future  Frequent PVCs Assessment & Plan: Reviewed prior EKGs with patient today, did review findings suggesting LVH prior EKG which had evidence of PVC, however was not significantly different from his most recent EKG.   Acute nonintractable headache, unspecified headache type Assessment & Plan: Possibly related to sleeping position, issues with pillow which she has made changes to and noted improvement.  Sometimes can have musculoskeletal issues such as neck pain which could be triggering headaches which may be the case for her.  Did advise on maintaining headache diary in order to monitor moving forward and to determine if there are any specific associations or triggers for patient.   Other orders -     Fluticasone Propionate; Place 1 spray into both nostrils daily.  Dispense: 16 g; Refill: 1 -     Fexofenadine HCl; Take 1 tablet (180 mg total) by mouth daily.  Dispense: 90 tablet; Refill: 1  Return in about 2 months (around 06/01/2023) for CPE with fasting labs 1 week prior.   Spent 46 minutes on this patient encounter, including preparation, chart review, face-to-face counseling with patient and  coordination of care, and documentation of encounter   ___________________________________________ Cynthia Dupree de Peru, MD, ABFM, [facility] Primary Care and Sports Medicine [facility]

## 2023-04-01 NOTE — Progress Notes (Addendum)
  Cardiology Office Note:   Date:  04/02/2023  ID:  Cynthia Wang, DOB 08-Apr-1971, MRN [id number] PCP: de Peru, Raymond J, MD  [facility] Providers Cardiologist:  Rollene Rotunda, MD {  History of Present Illness:   Cynthia Wang is a 52 y.o. female 52 y.o. female.  The patient has past medical history of frequent PVCs.  She saw Dr. Tomie China.   She has normal coronaries and zero calcium in 2022.  I She had a normal echo in 2020.      The patient wanted another opinion about her EKG.  This was done in November that demonstrated left ventricular hypertrophy and there were inferior Q waves and questionable left atrial enlargement.  She was very stressed about this.  She does occasionally feel the PVCs but nothing more than yesterday.  She has not had any clear bigeminy.  She might get these once in a while when she is more emotionally stressed.  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  She has a lot of stress setting this up.  She denies any chest pressure, neck or arm discomfort.  She is not having any new PND or orthopnea.  She has had no weight gain or edema.  ROS: As stated in the HPI and negative for all other systems.  Studies Reviewed:    EKG:   Sinus rhythm, rate 68, axis within normal limits, intervals within normal limits, nonspecific inferior Q waves not diagnostic of inferior MI, left ventricular hypertrophy by voltage criteria.  02/25/2023  Risk Assessment/Calculations:              Physical Exam:   VS:  BP 112/74   Pulse (!) 58   SpO2 100%    Wt Readings from Last 3 Encounters:  03/31/23 151 lb 12.8 oz (68.9 kg)  02/25/23 149 lb 3.2 oz (67.7 kg)  11/11/21 150 lb 3.2 oz (68.1 kg)     GEN: Well nourished, well developed in no acute distress NECK: No JVD; No carotid bruits CARDIAC: RRR, no murmurs, rubs, gallops RESPIRATORY:  Clear to auscultation without rales, wheezing or rhonchi  ABDOMEN: Soft, non-tender,  non-distended EXTREMITIES:  No edema; No deformity   ASSESSMENT AND PLAN:   PVCs: The patient's palpitations are better  No change in therapy.  We talked about possible therapies of this and she would not want anything as she is or not particularly symptomatic.  I will check a TSH and a BMET if I cannot find a recent 1 done by her primary provider.  Abnormal EKG: She does have an EKG that is abnormal as described above which might represent  left ventricular  hypertrophy though her exam is normal and the previous echo was normal.  I will follow-up with an echo.     Follow up with me in about 4 months.  Signed, Rollene Rotunda, MD

## 2023-04-02 ENCOUNTER — Ambulatory Visit: Payer: 59 | Attending: Cardiology | Admitting: Cardiology

## 2023-04-02 ENCOUNTER — Encounter: Payer: Self-pay | Admitting: Cardiology

## 2023-04-02 VITALS — BP 112/74 | HR 58

## 2023-04-02 DIAGNOSIS — Z0000 Encounter for general adult medical examination without abnormal findings: Secondary | ICD-10-CM | POA: Diagnosis not present

## 2023-04-02 DIAGNOSIS — R519 Headache, unspecified: Secondary | ICD-10-CM | POA: Insufficient documentation

## 2023-04-02 DIAGNOSIS — I493 Ventricular premature depolarization: Secondary | ICD-10-CM | POA: Diagnosis not present

## 2023-04-02 DIAGNOSIS — R002 Palpitations: Secondary | ICD-10-CM | POA: Diagnosis not present

## 2023-04-02 DIAGNOSIS — J309 Allergic rhinitis, unspecified: Secondary | ICD-10-CM | POA: Insufficient documentation

## 2023-04-02 NOTE — Assessment & Plan Note (Signed)
Previously utilizing the cetirizine without significant relief of symptoms.  We did discuss alternatives including fexofenadine, loratadine.  She will look to utilize fexofenadine.  Can also utilize intranasal sprays including intranasal steroid spray such as Flonase.  We will monitor progress with this, if continue to have issues despite above, consider referral to ENT or allergist

## 2023-04-02 NOTE — Patient Instructions (Signed)
Medication Instructions:  No changes.  *If you need a refill on your cardiac medications before your next appointment, please call your pharmacy*   Lab Work: Labs released from PCP. If you have labs (blood work) drawn today and your tests are completely normal, you will receive your results only by: MyChart Message (if you have MyChart) OR A paper copy in the mail If you have any lab test that is abnormal or we need to change your treatment, we will call you to review the results.   Testing/Procedures: Your physician has requested that you have an echocardiogram. Echocardiography is a painless test that uses sound waves to create images of your heart. It provides your doctor with information about the size and shape of your heart and how well your heart's chambers and valves are working. This procedure takes approximately one hour. There are no restrictions for this procedure. Please do NOT wear cologne, perfume, aftershave, or lotions (deodorant is allowed). Please arrive 15 minutes prior to your appointment time.  Please note: We ask at that you not bring children with you during ultrasound (echo/ vascular) testing. Due to room size and safety concerns, children are not allowed in the ultrasound rooms during exams. Our front office staff cannot provide observation of children in our lobby area while testing is being conducted. An adult accompanying a patient to their appointment will only be allowed in the ultrasound room at the discretion of the ultrasound technician under special circumstances. We apologize for any inconvenience.    Follow-Up: At [facility], you and your health needs are our priority.  As part of our continuing mission to provide you with exceptional heart care, we have created designated Provider Care Teams.  These Care Teams include your primary Cardiologist (physician) and Advanced Practice Providers (APPs -  Physician Assistants and Nurse Practitioners) who  all work together to provide you with the care you need, when you need it.  We recommend signing up for the patient portal called "MyChart".  Sign up information is provided on this After Visit Summary.  MyChart is used to connect with patients for Virtual Visits (Telemedicine).  Patients are able to view lab/test results, encounter notes, upcoming appointments, etc.  Non-urgent messages can be sent to your provider as well.   To learn more about what you can do with MyChart, go to [email or web address].    Your next appointment:   4 month(s)  Provider:   Rollene Rotunda, MD

## 2023-04-02 NOTE — Assessment & Plan Note (Signed)
Possibly related to sleeping position, issues with pillow which she has made changes to and noted improvement.  Sometimes can have musculoskeletal issues such as neck pain which could be triggering headaches which may be the case for her.  Did advise on maintaining headache diary in order to monitor moving forward and to determine if there are any specific associations or triggers for patient.

## 2023-04-02 NOTE — Assessment & Plan Note (Signed)
Reviewed prior EKGs with patient today, did review findings suggesting LVH prior EKG which had evidence of PVC, however was not significantly different from his most recent EKG.

## 2023-04-03 LAB — COMPREHENSIVE METABOLIC PANEL
ALT: 8 [IU]/L (ref 0–32)
AST: 19 [IU]/L (ref 0–40)
Albumin: 4.4 g/dL (ref 3.8–4.9)
Alkaline Phosphatase: 48 [IU]/L (ref 44–121)
BUN/Creatinine Ratio: 12 (ref 9–23)
BUN: 10 mg/dL (ref 6–24)
Bilirubin Total: 0.5 mg/dL (ref 0.0–1.2)
CO2: 24 mmol/L (ref 20–29)
Calcium: 9.1 mg/dL (ref 8.7–10.2)
Chloride: 106 mmol/L (ref 96–106)
Creatinine, Ser: 0.81 mg/dL (ref 0.57–1.00)
Globulin, Total: 2.5 g/dL (ref 1.5–4.5)
Glucose: 89 mg/dL (ref 70–99)
Potassium: 4.4 mmol/L (ref 3.5–5.2)
Sodium: 141 mmol/L (ref 134–144)
Total Protein: 6.9 g/dL (ref 6.0–8.5)
eGFR: 86 mL/min/{1.73_m2} (ref >59)

## 2023-04-03 LAB — CBC WITH DIFFERENTIAL/PLATELET
Basophils Absolute: 0.1 10*3/uL (ref 0.0–0.2)
Basos: 2 %
EOS (ABSOLUTE): 0.1 10*3/uL (ref 0.0–0.4)
Eos: 2 %
Hematocrit: 40.7 % (ref 34.0–46.6)
Hemoglobin: 13.8 g/dL (ref 11.1–15.9)
Immature Grans (Abs): 0 10*3/uL (ref 0.0–0.1)
Immature Granulocytes: 0 %
Lymphocytes Absolute: 1.6 10*3/uL (ref 0.7–3.1)
Lymphs: 41 %
MCH: 31.5 pg (ref 26.6–33.0)
MCHC: 33.9 g/dL (ref 31.5–35.7)
MCV: 93 fL (ref 79–97)
Monocytes Absolute: 0.4 10*3/uL (ref 0.1–0.9)
Monocytes: 10 %
Neutrophils Absolute: 1.8 10*3/uL (ref 1.4–7.0)
Neutrophils: 45 %
Platelets: 270 10*3/uL (ref 150–450)
RBC: 4.38 x10E6/uL (ref 3.77–5.28)
RDW: 12.1 % (ref 11.7–15.4)
WBC: 3.9 10*3/uL (ref 3.4–10.8)

## 2023-04-03 LAB — HEMOGLOBIN A1C
Est. average glucose Bld gHb Est-mCnc: 103 mg/dL
Hgb A1c MFr Bld: 5.2 % (ref 4.8–5.6)

## 2023-04-03 LAB — LIPID PANEL
Chol/HDL Ratio: 3.3 {ratio} (ref 0.0–4.4)
Cholesterol, Total: 220 mg/dL — ABNORMAL HIGH (ref 100–199)
HDL: 67 mg/dL (ref >39)
LDL Chol Calc (NIH): 144 mg/dL — ABNORMAL HIGH (ref 0–99)
Triglycerides: 53 mg/dL (ref 0–149)
VLDL Cholesterol Cal: 9 mg/dL (ref 5–40)

## 2023-04-03 LAB — TSH RFX ON ABNORMAL TO FREE T4: TSH: 0.442 u[IU]/mL — ABNORMAL LOW (ref 0.450–4.500)

## 2023-04-03 LAB — T4F: T4,Free (Direct): 1.2 ng/dL (ref 0.82–1.77)

## 2023-04-22 ENCOUNTER — Other Ambulatory Visit (HOSPITAL_BASED_OUTPATIENT_CLINIC_OR_DEPARTMENT_OTHER): Payer: Self-pay | Admitting: Family Medicine

## 2023-04-27 ENCOUNTER — Encounter (HOSPITAL_BASED_OUTPATIENT_CLINIC_OR_DEPARTMENT_OTHER): Payer: Self-pay | Admitting: Family Medicine

## 2023-04-28 NOTE — Telephone Encounter (Signed)
 Called pt pt scheduled for 1/22 advised on other options pt will utilize if issue gets worse otherwise will follow up on 1/22

## 2023-05-05 ENCOUNTER — Ambulatory Visit (HOSPITAL_BASED_OUTPATIENT_CLINIC_OR_DEPARTMENT_OTHER): Payer: 59 | Admitting: Family Medicine

## 2023-05-11 ENCOUNTER — Encounter (HOSPITAL_BASED_OUTPATIENT_CLINIC_OR_DEPARTMENT_OTHER): Payer: Self-pay

## 2023-05-11 ENCOUNTER — Ambulatory Visit (INDEPENDENT_AMBULATORY_CARE_PROVIDER_SITE_OTHER): Payer: 59 | Admitting: Family Medicine

## 2023-05-11 ENCOUNTER — Encounter (HOSPITAL_BASED_OUTPATIENT_CLINIC_OR_DEPARTMENT_OTHER): Payer: Self-pay | Admitting: Family Medicine

## 2023-05-11 VITALS — BP 131/84 | HR 63 | Ht 67.0 in | Wt 154.3 lb

## 2023-05-11 DIAGNOSIS — E0590 Thyrotoxicosis, unspecified without thyrotoxic crisis or storm: Secondary | ICD-10-CM | POA: Diagnosis not present

## 2023-05-11 DIAGNOSIS — L989 Disorder of the skin and subcutaneous tissue, unspecified: Secondary | ICD-10-CM | POA: Diagnosis not present

## 2023-05-11 DIAGNOSIS — R519 Headache, unspecified: Secondary | ICD-10-CM | POA: Diagnosis not present

## 2023-05-11 MED ORDER — IBUPROFEN 800 MG PO TABS: 800.0000 mg | ORAL_TABLET | Freq: Three times a day (TID) | ORAL | 1 refills | Status: DC | PRN

## 2023-05-11 NOTE — Assessment & Plan Note (Signed)
Notes area over the left side of nose which has been present for some time.  She indicates being prescribed topical medication in the past, although not sure of name. On exam, patient with slightly raised area along the left side of nose.  Difficult to fully assess due to overlying make-up.  No obvious surrounding erythema. Can proceed with referral to dermatology for further evaluation

## 2023-05-11 NOTE — Progress Notes (Signed)
    Procedures performed today:    None.  Independent interpretation of notes and tests performed by another provider:   None.  Brief History, Exam, Impression, and Recommendations:    BP 131/84 (BP Location: Right Arm, Patient Position: Sitting, Cuff Size: Normal)   Pulse 63   Ht 5\' 7"  (1.702 m)   Wt 154 lb 4.8 oz (70 kg)   SpO2 100%   BMI 24.17 kg/m   Acute nonintractable headache, unspecified headache type Assessment & Plan: Patient reports that in recent weeks she has had increasing issues with headaches.  She notes that her headaches have become more frequent severe.  She does have history of headaches, however these were more limited in frequency and severity.  She currently has been taking Tylenol primarily to help with symptoms, will have some relief with this, but tends to be short-lived.  She has occasionally used ibuprofen recently, was prescribed this more in the past.  Denies other medications being used currently.  At last appointment, we did discuss maintaining headache diary, she has not done so.  She is not aware of any triggers for her headaches, although does note that lights and sound will make her headaches worse when they are present.  She rates her headaches at about 8-9 out of 10 prior to medication.  She has had some nighttime awakenings due to the headaches. On exam, patient is in no acute distress, vital signs stable.  Cardiovascular exam regular rate and rhythm, no murmur appreciated.  Pupils equal, round, reactive to light and accommodation.  Extraocular movements intact.  Normal gait in office today. Red flags present on exam including increasing headache severity and frequency with nighttime awakenings present as well. Given these findings, we will proceed with MRI at this time.  Can continue with, NSAID to help with control of symptoms We will also refer to neurology for further evaluation and recommendations  Orders: -     Ambulatory referral to  Neurology -     MR BRAIN WO CONTRAST; Future  Subclinical hyperthyroidism Assessment & Plan: Noted on recent labs with mildly decreased TSH, normal free T4.  Discussed findings with patient, no current signs or symptoms of overt hyperthyroidism. Will plan to monitor labs moving forward, likely check around time of next office visit for physical upcoming   Skin lesion of face Assessment & Plan: Notes area over the left side of nose which has been present for some time.  She indicates being prescribed topical medication in the past, although not sure of name. On exam, patient with slightly raised area along the left side of nose.  Difficult to fully assess due to overlying make-up.  No obvious surrounding erythema. Can proceed with referral to dermatology for further evaluation  Orders: -     Ambulatory referral to Dermatology  Other orders -     Ibuprofen; Take 1 tablet (800 mg total) by mouth every 8 (eight) hours as needed.  Dispense: 60 tablet; Refill: 1  Return if symptoms worsen or fail to improve, for Return for scheduled physical.   ___________________________________________ Andrina Locken de [place], MD, ABFM, CAQSM Primary Care and Sports Medicine [facility]

## 2023-05-11 NOTE — Assessment & Plan Note (Signed)
Noted on recent labs with mildly decreased TSH, normal free T4.  Discussed findings with patient, no current signs or symptoms of overt hyperthyroidism. Will plan to monitor labs moving forward, likely check around time of next office visit for physical upcoming

## 2023-05-11 NOTE — Patient Instructions (Signed)
  Medication Instructions:  Your physician recommends that you continue on your current medications as directed. Please refer to the Current Medication list given to you today. --If you need a refill on any your medications before your next appointent, please call your pharmacy first. If no refills are authorized on file call the office.-- Lab Work: Your physician has recommended that you have lab work today: No If you have labs (blood work) drawn today and your tests are completely normal, you will receive your results via MyChart message OR a phone call from our staff.  Please ensure you check your voicemail in the event that you authorized detailed messages to be left on a delegated number. If you have any lab test that is abnormal or we need to change your treatment, we will call you to review the results.  Referrals/Procedures/Imaging: MRI  Follow-Up: Your next appointment:  Your physician recommends that you schedule a follow-up appointment as needed with Dr. de Peru  You will receive a text message or e-mail with a link to a survey about your care and experience with [place] today! We would greatly appreciate your feedback!   Thanks for letting us be apart of your health journey!!  Primary Care and Sports Medicine   Dr. Ceasar Mons [place]   We encourage you to activate your patient portal called "MyChart".  Sign up information is provided on this After Visit Summary.  MyChart is used to connect with patients for Virtual Visits (Telemedicine).  Patients are able to view lab/test results, encounter notes, upcoming appointments, etc.  Non-urgent messages can be sent to your provider as well. To learn more about what you can do with MyChart, please visit --  [email or web address].

## 2023-05-11 NOTE — Assessment & Plan Note (Signed)
Patient reports that in recent weeks she has had increasing issues with headaches.  She notes that her headaches have become more frequent severe.  She does have history of headaches, however these were more limited in frequency and severity.  She currently has been taking Tylenol primarily to help with symptoms, will have some relief with this, but tends to be short-lived.  She has occasionally used ibuprofen recently, was prescribed this more in the past.  Denies other medications being used currently.  At last appointment, we did discuss maintaining headache diary, she has not done so.  She is not aware of any triggers for her headaches, although does note that lights and sound will make her headaches worse when they are present.  She rates her headaches at about 8-9 out of 10 prior to medication.  She has had some nighttime awakenings due to the headaches. On exam, patient is in no acute distress, vital signs stable.  Cardiovascular exam regular rate and rhythm, no murmur appreciated.  Pupils equal, round, reactive to light and accommodation.  Extraocular movements intact.  Normal gait in office today. Red flags present on exam including increasing headache severity and frequency with nighttime awakenings present as well. Given these findings, we will proceed with MRI at this time.  Can continue with, NSAID to help with control of symptoms We will also refer to neurology for further evaluation and recommendations

## 2023-05-12 ENCOUNTER — Ambulatory Visit (HOSPITAL_COMMUNITY)
Admission: RE | Admit: 2023-05-12 | Discharge: 2023-05-12 | Disposition: A | Discharge status: Home / Self Care | Payer: 59 | Source: Ambulatory Visit | Attending: Cardiovascular Disease | Admitting: Cardiovascular Disease

## 2023-05-12 DIAGNOSIS — I493 Ventricular premature depolarization: Secondary | ICD-10-CM | POA: Insufficient documentation

## 2023-05-12 DIAGNOSIS — R002 Palpitations: Secondary | ICD-10-CM | POA: Insufficient documentation

## 2023-05-12 LAB — ECHOCARDIOGRAM COMPLETE
AR max vel: 2.2 cm2
AV Area VTI: 2.16 cm2
AV Area mean vel: 2.07 cm2
AV Mean grad: 2 mmHg
AV Peak grad: 3.2 mmHg
Ao pk vel: 0.9 m/s
Area-P 1/2: 5.02 cm2
MV M vel: 2.22 m/s
MV Peak grad: 19.7 mmHg
S' Lateral: 2.34 cm

## 2023-05-13 DIAGNOSIS — L820 Inflamed seborrheic keratosis: Secondary | ICD-10-CM | POA: Diagnosis not present

## 2023-05-15 ENCOUNTER — Encounter: Payer: Self-pay | Admitting: Cardiology

## 2023-05-18 ENCOUNTER — Encounter: Payer: Self-pay | Admitting: Neurology

## 2023-05-19 ENCOUNTER — Encounter (HOSPITAL_BASED_OUTPATIENT_CLINIC_OR_DEPARTMENT_OTHER): Payer: Self-pay | Admitting: *Deleted

## 2023-05-26 ENCOUNTER — Telehealth (HOSPITAL_BASED_OUTPATIENT_CLINIC_OR_DEPARTMENT_OTHER): Payer: Self-pay | Admitting: *Deleted

## 2023-05-26 ENCOUNTER — Other Ambulatory Visit (HOSPITAL_BASED_OUTPATIENT_CLINIC_OR_DEPARTMENT_OTHER): Payer: 59

## 2023-05-26 DIAGNOSIS — E0590 Thyrotoxicosis, unspecified without thyrotoxic crisis or storm: Secondary | ICD-10-CM

## 2023-05-26 NOTE — Telephone Encounter (Signed)
Copied from CRM [id number]. Topic: Clinical - Medical Advice >> May 26, 2023  8:04 AM Gaetano Hawthorne wrote: Reason for CRM: Patient cancelled her lab appointment today with the weather issues - does she need to reschedule or are the labs from 12/20 sufficient? Contact Center Agent did not see any future labs placed. Please call patient - they have an MRI on Friday and Neurology appointment in March - it also appears that they have a physical with Dr Tommi Rumps [place] next week.

## 2023-05-26 NOTE — Addendum Note (Signed)
Addended by: DE [place], Marcy Salvo J on: 05/26/2023 12:07 PM   Modules accepted: Orders

## 2023-05-26 NOTE — Telephone Encounter (Signed)
Last office note says labs one week before which labs would you like to order

## 2023-05-26 NOTE — Telephone Encounter (Signed)
Pt advised she will have this drawn at the appt

## 2023-05-28 ENCOUNTER — Ambulatory Visit
Admission: RE | Admit: 2023-05-28 | Discharge: 2023-05-28 | Disposition: A | Discharge status: Home / Self Care | Payer: 59 | Source: Ambulatory Visit | Attending: Family Medicine | Admitting: Family Medicine

## 2023-05-28 DIAGNOSIS — R519 Headache, unspecified: Secondary | ICD-10-CM | POA: Diagnosis not present

## 2023-06-02 ENCOUNTER — Encounter (HOSPITAL_BASED_OUTPATIENT_CLINIC_OR_DEPARTMENT_OTHER): Payer: Self-pay | Admitting: Family Medicine

## 2023-06-02 ENCOUNTER — Telehealth (INDEPENDENT_AMBULATORY_CARE_PROVIDER_SITE_OTHER): Payer: 59 | Admitting: Family Medicine

## 2023-06-02 ENCOUNTER — Encounter (HOSPITAL_BASED_OUTPATIENT_CLINIC_OR_DEPARTMENT_OTHER): Payer: 59 | Admitting: Family Medicine

## 2023-06-02 DIAGNOSIS — G43909 Migraine, unspecified, not intractable, without status migrainosus: Secondary | ICD-10-CM | POA: Diagnosis not present

## 2023-06-02 NOTE — Progress Notes (Unsigned)
   Virtual Visit   I connected with  Rolly Salter  on 06/04/23 by telehealth and verified that I am speaking with the correct person using two identifiers. Visit completed via video.   I discussed the limitations, risks, security and privacy concerns of performing an evaluation and management service by telephone, including the higher likelihood of inaccurate diagnosis and treatment, and the availability of in person appointments.  We also discussed the likely need of an additional face to face encounter for complete and high quality delivery of care.  I also discussed with the patient that there may be a patient responsible charge related to this service. The patient expressed understanding and wishes to proceed.  Provider location is in medical facility. Patient location is at their home, different from provider location. People involved in care of the patient during this telehealth encounter were myself, my nurse/medical assistant, and my front office/scheduling team member.  Review of Systems: No fevers, chills, night sweats, weight loss, chest pain, or shortness of breath.   Objective Findings:    General: Speaking full sentences, no audible heavy breathing.  Sounds alert and appropriately interactive.    Independent interpretation of tests performed by another provider:   None.  Brief History, Exam, Impression, and Recommendations:    Migraine Patient continues with intermittent headaches.  Virtual visit today to review symptoms and recent MRI completed due to some concerning features with headache history. MRI was reassuring with no acute intracranial abnormality or mass observed.  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 Patient does have appointment upcoming with neurology, however this is not scheduled till next month.  She is on a cancellation list and  is hopeful that she may be able to get sooner appointment. Recommend continuing with scheduled an appointment with neurology.  No findings on MRI which would necessitate need for more urgent evaluation.  I discussed the above assessment and treatment plan with the patient. The patient was provided an opportunity to ask questions and all were answered. The patient agreed with the plan and demonstrated an understanding of the instructions.  The patient was advised to call back or seek an in-person evaluation if the symptoms worsen or if the condition fails to improve as anticipated.  I provided 12 minutes of face to face and non-face-to-face time during this encounter date, time was needed to gather information, review chart, records, communicate/coordinate with staff remotely, as well as complete documentation.   ___________________________________________ Windie Marasco de [place], MD, ABFM, CAQSM Primary Care and Sports Medicine [facility]

## 2023-06-04 NOTE — Assessment & Plan Note (Signed)
 Patient continues with intermittent headaches.  Virtual visit today to review symptoms and recent MRI completed due to some concerning features with headache history. MRI was reassuring with no acute intracranial abnormality or mass observed.  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 Patient does have appointment upcoming with neurology, however this is not scheduled till next month.  She is on a cancellation list and is hopeful that she may be able to get sooner appointment. Recommend continuing with scheduled an appointment with neurology.  No findings on MRI which would necessitate need for more urgent evaluation.

## 2023-07-01 NOTE — Progress Notes (Deleted)
 Initial neurology clinic note  Iver Fehrenbach MRN: [id number] DOB: [birth year]  Referring provider: de [place], Raymond J, MD  Primary care provider: de [place], Raymond J, MD  Reason for consult:  headaches  Subjective:  This is Ms. Rolly Salter, a 53 y.o. ***-handed female with a medical history of *** who presents to neurology clinic with ***. The patient is accompanied by ***.  *** Increasing frequency and intensity of chronic headaches Taking tylenol for headaches with provides mild short term relief Photophobia and phonophobia 8-9/10 Can wake her up  MRI brain was ordered by PCP and showed no acute pathology. There was a prominent perivascular space vs small neuroglial cyst noted in right frontal lobe white matter (likely asymptomatic***).  MEDICATIONS:  Outpatient Encounter Medications as of 07/09/2023  Medication Sig   estradiol (VIVELLE-DOT) 0.1 MG/24HR patch Place 1 patch onto the skin 2 (two) times a week.   fexofenadine (ALLEGRA) 180 MG tablet Take 1 tablet (180 mg total) by mouth daily.   fluticasone (FLONASE) 50 MCG/ACT nasal spray SPRAY 1 SPRAY INTO BOTH NOSTRILS DAILY.   ibuprofen (ADVIL) 800 MG tablet Take 1 tablet (800 mg total) by mouth every 8 (eight) hours as needed.   No facility-administered encounter medications on file as of 07/09/2023.    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   Migraine    Ovarian cancer (HCC)    Screening cholesterol level 11/04/2018   Sinusitis, acute 12/07/2014    PAST SURGICAL HISTORY: Past Surgical History:  Procedure Laterality Date   ABDOMINAL HYSTERECTOMY     HERNIA REPAIR      ALLERGIES: Allergies  Allergen Reactions   Neosporin [Neomycin-Bacitracin Zn-Polymyx]  Swelling and Rash    FAMILY HISTORY: Family History  Problem Relation Age of Onset   Arthritis Mother    Depression Mother    Healthy Father    Arthritis Maternal Grandmother    Hypertension Maternal Grandmother     SOCIAL HISTORY: Social History   Tobacco Use   Smoking status: Former    Current packs/day: 0.00    Types: Cigarettes    Quit date: 09/27/2006    Years since quitting: 16.7    Passive exposure: Past   Smokeless tobacco: Never  Vaping Use   Vaping status: Some Days  Substance Use Topics   Alcohol use: No   Drug use: No   Social History   Social History Narrative   Fun: Copywriter, advertising game   Denies religious beliefs effecting health care.   Feels safe at home and denies abuse.     Objective:  Vital Signs:  There were no vitals taken for this visit.  ***  Labs and Imaging review: Internal labs: 04/02/23: TSH low at 0.442 Free T4 wnl HbA1c: 5.2 Lipid panel: tChol 220, LDL 144, TG 53 CMP unremarkable CBC w/ diff unremarkable  External labs: ***  Imaging/Procedures: MRI brain wo contrast (05/28/23): FINDINGS: Brain:   No age-advanced or lobar predominant cerebral atrophy.   4 mm round T2 hyperintense focus within the anterior right frontal lobe white matter, which may reflect a prominent perivascular space or small neuroglial cyst (series 12, image 15) (series 11, image 21).   No cortical encephalomalacia is identified.   There is no acute infarct.  No evidence of an intracranial mass.   No chronic intracranial blood products.   No extra-axial fluid collection.   No midline shift.   Vascular: Maintained flow voids within the proximal large arterial vessels.   Skull and upper cervical spine: No focal worrisome marrow lesion.   Sinuses/Orbits: No mass or acute finding within the imaged orbits. No significant paranasal sinus disease.   Other: Nasal septal defect (series 14, image 29).   IMPRESSION: 1.  No evidence of an  acute intracranial abnormality. 2. Prominent perivascular space versus small neuroglial cyst within the anterior right frontal lobe white matter. 3. Otherwise unremarkable non-contrast MRI appearance of the brain. 4. Nasal septal defect. ***  Assessment/Plan:  Magdelyn Roebuck is a 53 y.o. female who presents for evaluation of ***. *** has a relevant medical history of ***. *** neurological examination is pertinent for ***. Available diagnostic data is significant for ***. This constellation of symptoms and objective data would most likely localize to ***. ***  PLAN: -Blood work: *** ***  -Return to clinic ***  The impression above as well as the plan as outlined below were extensively discussed with the patient (in the company of ***) who voiced understanding. All questions were answered to their satisfaction.  The patient was counseled on pertinent fall precautions per the printed material provided today, and as noted under the "Patient Instructions" section below.***  When available, results of the above investigations and possible further recommendations will be communicated to the patient via telephone/MyChart. Patient to call office if not contacted after expected testing turnaround time.   Total time spent reviewing records, interview, history/exam, documentation, and coordination of care on day of encounter:  *** min   Thank you for allowing me to participate in patient's care.  If I can answer any additional questions, I would be pleased to do so.  Jacquelyne Balint, MD   CC: de [place], Buren Kos, MD [address] [place] [address]  CC: Referring provider: de [place], Buren Kos, MD [address] [place],  [place] [address]

## 2023-07-09 ENCOUNTER — Ambulatory Visit: Payer: 59 | Admitting: Neurology

## 2023-07-24 ENCOUNTER — Other Ambulatory Visit (HOSPITAL_BASED_OUTPATIENT_CLINIC_OR_DEPARTMENT_OTHER): Payer: Self-pay | Admitting: Family Medicine

## 2023-08-06 ENCOUNTER — Ambulatory Visit: Payer: 59 | Admitting: Cardiology
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
## 2023-08-10 NOTE — Progress Notes (Unsigned)
  Cardiology Office Note:   Date:  08/11/2023  ID:  Cynthia Wang, DOB [birth year], MRN [id number] PCP: de Peru, Raymond J, MD  [facility] Providers Cardiologist:  Eilleen Grates, MD {  History of Present Illness:   Cynthia Wang is a 53 y.o. female The patient has past medical history of frequent PVCs.  She saw Dr. Revankar.   She has normal coronaries and zero calcium in 2022.  I She had a normal echo in 2020.       The patient wanted another opinion about her EKG.  This was done in November that demonstrated left ventricular hypertrophy and there were inferior Q waves and questionable left atrial enlargement.  She has PVCs and she does occasionally feel the PVCs .  Follow up echo in Jan 2025 demonstrated NL EF and no LVH.  There were normal pressures and no valvular abnormalities.    Since I last saw her she has done well.  The patient denies any new symptoms such as chest discomfort, neck or arm discomfort. There has been no new shortness of breath, PND or orthopnea. There have been no reported palpitations, presyncope or syncope.   She does use a treadmill desk and has no symptoms related to this.    ROS: As stated in the HPI and negative for all other systems.  Studies Reviewed:    EKG:   Sinus rhythm, rate 68, axis within normal limits, intervals within normal limits, repolarization changes, 02/25/2023.   Risk Assessment/Calculations:              Physical Exam:   VS:  BP 128/78 (BP Location: Left Arm, Patient Position: Sitting, Cuff Size: Normal)   Pulse (!) 55   Ht 5\' 7"  (1.702 m)   Wt 149 lb 3.2 oz (67.7 kg)   SpO2 100%   BMI 23.37 kg/m    Wt Readings from Last 3 Encounters:  08/11/23 149 lb 3.2 oz (67.7 kg)  05/11/23 154 lb 4.8 oz (70 kg)  03/31/23 151 lb 12.8 oz (68.9 kg)     GEN: Well nourished, well developed in no acute distress NECK: No JVD; No carotid bruits CARDIAC: RRR, no murmurs, rubs, gallops RESPIRATORY:  Clear to auscultation without rales,  wheezing or rhonchi  ABDOMEN: Soft, non-tender, non-distended EXTREMITIES:  No edema; No deformity   ASSESSMENT AND PLAN:   PVCs:   The patient is not experiencing any new palpitations.  No change in therapy.   Abnormal EKG:   She had an unremarkable echocardiogram in January 2025.  There were no valvular abnormalities, LVH or left ventricular wall motion abnormalities.  She had a normal EF.  No change in therapy.  Dyslipidemia: Her LDL was 144.  However, she had a 0 calcium score in 2022.  We talked about diet.  She prefers to be off medication.  No change in therapy.  I will repeat a calcium score in 2 years.    Follow up with me after the calcium score.  Signed, Eilleen Grates, MD

## 2023-08-11 ENCOUNTER — Encounter: Payer: Self-pay | Admitting: Cardiology

## 2023-08-11 ENCOUNTER — Ambulatory Visit: Payer: 59 | Attending: Cardiology | Admitting: Cardiology

## 2023-08-11 VITALS — BP 128/78 | HR 55 | Ht 67.0 in | Wt 149.2 lb

## 2023-08-11 DIAGNOSIS — I493 Ventricular premature depolarization: Secondary | ICD-10-CM | POA: Diagnosis not present

## 2023-08-11 DIAGNOSIS — E785 Hyperlipidemia, unspecified: Secondary | ICD-10-CM | POA: Diagnosis not present

## 2023-08-11 DIAGNOSIS — R9431 Abnormal electrocardiogram [ECG] [EKG]: Secondary | ICD-10-CM

## 2023-08-11 NOTE — Patient Instructions (Addendum)
 Testing/Procedures: Calcium score in 2 years   CT scanning for a cardiac calcium score (CAT scanning), is a noninvasive, special x-ray that produces cross-sectional images of the body using x-rays and a computer. CT scans help physicians diagnose and treat medical conditions. For some CT exams, a contrast material is used to enhance visibility in the area of the body being studied. CT scans provide greater clarity and reveal more details than regular x-ray exams.   Follow-Up: At [facility], you and your health needs are our priority.  As part of our continuing mission to provide you with exceptional heart care, our providers are all part of one team.  This team includes your primary Cardiologist (physician) and Advanced Practice Providers or APPs (Physician Assistants and Nurse Practitioners) who all work together to provide you with the care you need, when you need it.  Your next appointment:   2 year(s)  Provider:   Eilleen Grates, MD

## 2023-08-20 ENCOUNTER — Ambulatory Visit: Admitting: Neurology

## 2023-08-20 ENCOUNTER — Encounter: Payer: Self-pay | Admitting: Neurology

## 2023-08-20 ENCOUNTER — Other Ambulatory Visit

## 2023-08-20 VITALS — BP 138/70 | HR 64 | Ht 67.0 in | Wt 150.0 lb

## 2023-08-20 DIAGNOSIS — F40298 Other specified phobia: Secondary | ICD-10-CM | POA: Diagnosis not present

## 2023-08-20 DIAGNOSIS — H53149 Visual discomfort, unspecified: Secondary | ICD-10-CM | POA: Diagnosis not present

## 2023-08-20 DIAGNOSIS — G43709 Chronic migraine without aura, not intractable, without status migrainosus: Secondary | ICD-10-CM | POA: Diagnosis not present

## 2023-08-20 LAB — VITAMIN B12: Vitamin B-12: 618 pg/mL (ref 200–1100)

## 2023-08-20 LAB — TSH: TSH: 0.39 m[IU]/L — ABNORMAL LOW

## 2023-08-20 MED ORDER — NORTRIPTYLINE HCL 10 MG PO CAPS: 20.0000 mg | ORAL_CAPSULE | Freq: Every day | ORAL | 5 refills | Status: DC

## 2023-08-20 MED ORDER — SUMATRIPTAN SUCCINATE 100 MG PO TABS: 100.0000 mg | ORAL_TABLET | Freq: Once | ORAL | 5 refills | Status: DC | PRN

## 2023-08-20 NOTE — Patient Instructions (Addendum)
 I saw you today for headaches. I think your headaches are migraines.  I would like to do some lab work today to look for other things that can contribute to headaches. I will be in touch when I have those results.  For your migraines: Migraine prevention:  Nortriptyline 10 mg at bedtime for 1 week, then 20 mg at bedtime thereafter Migraine rescue:  Sumatriptan 100 mg as need at headache onset, may repeat after 2 hours if needed Limit use of pain relievers to no more than 2 days out of week to prevent risk of rebound or medication-overuse headache. Keep headache diary  -Return to clinic in 3 months  The physicians and staff at [facility] are committed to providing excellent care. You may receive a survey requesting feedback about your experience at our office. We strive to receive "very good" responses to the survey questions. If you feel that your experience would prevent you from giving the office a "very good " response, please contact our office to try to remedy the situation. We may be reached at [phone]. Thank you for taking the time out of your busy day to complete the survey.  Rommie Coats, MD [facility]  More migraine information: Be aware of common food triggers:  - Caffeine:  coffee, black tea, cola, Mt. Dew  - Chocolate  - Dairy:  aged cheeses (brie, blue, cheddar, gouda, Parmasan, provolone, romano, Swiss, etc), chocolate milk, buttermilk, sour cream, limit eggs and yogurt  - Nuts, peanut butter  - Alcohol  - Cereals/grains:  FRESH breads (fresh bagels, sourdough, doughnuts), yeast productions  - Processed/canned/aged/cured meats (pre-packaged deli meats, hotdogs)  - MSG/glutamate:  soy sauce, flavor enhancer, pickled/preserved/marinated foods  - Sweeteners:  aspartame (Equal, Nutrasweet).  Sugar and Splenda are okay  - Vegetables:  legumes (lima beans, lentils, snow peas, fava beans, pinto peans, peas, garbanzo beans), sauerkraut, onions, olives, pickles  -  Fruit:  avocados, bananas, citrus fruit (orange, lemon, grapefruit), mango  - Other:  Frozen meals, macaroni and cheese Routine exercise Stay adequately hydrated (aim for 64 oz water daily) Keep headache diary Maintain proper stress management Maintain proper sleep hygiene Do not skip meals Consider supplements:  magnesium citrate 400mg  daily, riboflavin 400mg  daily, coenzyme Q10 100mg  three times daily.

## 2023-09-07 ENCOUNTER — Telehealth: Payer: Self-pay | Admitting: Neurology

## 2023-09-07 NOTE — Telephone Encounter (Signed)
 I called patient to discuss headaches. She had been having bad headaches for multiple days. The headache went away this weekend. She has not had a headache for 2 days.  She is taking nortriptyline  20 mg at bedtime. She is taking sumatriptan  as needed, and has already taken 8 doses. We again talked about medication overuse. Patient will watch this.  I offered to increase her nortriptyline , but patient would like to continue 20 mg for now. She will call if she has return of headaches that will not resolve.  All questions were answered.  Rommie Coats, MD [facility]

## 2023-09-11 ENCOUNTER — Other Ambulatory Visit: Payer: Self-pay | Admitting: Neurology

## 2023-09-11 DIAGNOSIS — G43709 Chronic migraine without aura, not intractable, without status migrainosus: Secondary | ICD-10-CM

## 2023-09-13 ENCOUNTER — Other Ambulatory Visit: Payer: Self-pay | Admitting: Neurology

## 2023-09-13 ENCOUNTER — Other Ambulatory Visit: Payer: Self-pay

## 2023-09-13 DIAGNOSIS — G43709 Chronic migraine without aura, not intractable, without status migrainosus: Secondary | ICD-10-CM

## 2023-09-13 MED ORDER — NORTRIPTYLINE HCL 10 MG PO CAPS: ORAL_CAPSULE | ORAL | 1 refills | Status: DC

## 2023-09-13 MED ORDER — NORTRIPTYLINE HCL 10 MG PO CAPS: 20.0000 mg | ORAL_CAPSULE | Freq: Every day | ORAL | 3 refills | Status: DC

## 2023-10-28 DIAGNOSIS — L820 Inflamed seborrheic keratosis: Secondary | ICD-10-CM | POA: Diagnosis not present

## 2023-11-07 ENCOUNTER — Other Ambulatory Visit (HOSPITAL_BASED_OUTPATIENT_CLINIC_OR_DEPARTMENT_OTHER): Payer: Self-pay | Admitting: Family Medicine

## 2023-11-30 ENCOUNTER — Ambulatory Visit: Payer: 59 | Admitting: Dermatology
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
## 2023-12-17 ENCOUNTER — Ambulatory Visit: Admitting: Neurology
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
## 2024-02-18 ENCOUNTER — Ambulatory Visit: Admitting: Neurology

## 2024-04-01 ENCOUNTER — Other Ambulatory Visit: Payer: Self-pay

## 2024-04-01 MED ORDER — ESTRADIOL 0.1 MG/24HR TD PTTW: 1.0000 | MEDICATED_PATCH | TRANSDERMAL | 6 refills | Status: AC
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
## 2024-04-21 ENCOUNTER — Encounter: Payer: Self-pay | Admitting: Neurology

## 2024-04-21 ENCOUNTER — Ambulatory Visit (INDEPENDENT_AMBULATORY_CARE_PROVIDER_SITE_OTHER): Payer: Self-pay | Admitting: Neurology

## 2024-04-21 DIAGNOSIS — G43709 Chronic migraine without aura, not intractable, without status migrainosus: Secondary | ICD-10-CM

## 2024-04-21 MED ORDER — NORTRIPTYLINE HCL 10 MG PO CAPS: 40.0000 mg | ORAL_CAPSULE | Freq: Every day | ORAL | 5 refills | Status: DC

## 2024-04-21 MED ORDER — RIZATRIPTAN BENZOATE 10 MG PO TBDP: 10.0000 mg | ORAL_TABLET | ORAL | 5 refills | Status: AC | PRN

## 2024-04-21 MED ORDER — NORTRIPTYLINE HCL 10 MG PO CAPS: 20.0000 mg | ORAL_CAPSULE | Freq: Every day | ORAL | 3 refills | Status: DC

## 2024-04-21 NOTE — Patient Instructions (Signed)
 Migraine prevention:  Restart nortriptyline  20 mg at bedtime (2 capsules) for 1 week, then increase to 40 mg daily (4 capsules) thereafter Migraine rescue:  Stop sumatriptan . Start rizatriptan  10 mg as needed at headache onset, can repeat in 2 hours if needed Limit use of pain relievers to no more than 2 days out of week to prevent risk of rebound or medication-overuse headache. Keep headache diary  Please update me in about 1 month on headaches.  Return to clinic in 6 months  Please let me know if you have any questions or concerns in the meantime.  The physicians and staff at [facility] are committed to providing excellent care. You may receive a survey requesting feedback about your experience at our office. We strive to receive very good responses to the survey questions. If you feel that your experience would prevent you from giving the office a very good  response, please contact our office to try to remedy the situation. We may be reached at [phone]. Thank you for taking the time out of your busy day to complete the survey.  Venetia Potters, MD  Neurology  More migraine information: Be aware of common food triggers:  - Caffeine:  coffee, black tea, cola, Mt. Dew  - Chocolate  - Dairy:  aged cheeses (brie, blue, cheddar, gouda, Parmasan, provolone, romano, Swiss, etc), chocolate milk, buttermilk, sour cream, limit eggs and yogurt  - Nuts, peanut butter  - Alcohol  - Cereals/grains:  FRESH breads (fresh bagels, sourdough, doughnuts), yeast productions  - Processed/canned/aged/cured meats (pre-packaged deli meats, hotdogs)  - MSG/glutamate:  soy sauce, flavor enhancer, pickled/preserved/marinated foods  - Sweeteners:  aspartame (Equal, Nutrasweet).  Sugar and Splenda are okay  - Vegetables:  legumes (lima beans, lentils, snow peas, fava beans, pinto peans, peas, garbanzo beans), sauerkraut, onions, olives, pickles  - Fruit:  avocados, bananas, citrus fruit (orange,  lemon, grapefruit), mango  - Other:  Frozen meals, macaroni and cheese Routine exercise Stay adequately hydrated (aim for 64 oz water daily) Keep headache diary Maintain proper stress management Maintain proper sleep hygiene Do not skip meals Consider supplements:  magnesium citrate 400mg  daily, riboflavin 400mg  daily, coenzyme Q10 100mg  three times daily.

## 2024-05-15 ENCOUNTER — Other Ambulatory Visit: Payer: Self-pay | Admitting: Neurology

## 2024-05-15 DIAGNOSIS — G43709 Chronic migraine without aura, not intractable, without status migrainosus: Secondary | ICD-10-CM

## 2024-05-16 ENCOUNTER — Other Ambulatory Visit: Payer: Self-pay

## 2024-05-18 ENCOUNTER — Ambulatory Visit: Admitting: Neurology

## 2024-06-16 ENCOUNTER — Ambulatory Visit: Admitting: Neurology

## 2024-10-27 ENCOUNTER — Ambulatory Visit: Payer: Self-pay | Admitting: Neurology
